# Patient Record
Sex: Female | Born: 1990 | Race: Black or African American | Hispanic: No | Marital: Single | Smoking: Never smoker
Health system: Southern US, Community
[De-identification: ages and names within clinical notes are randomized; demographics above are authoritative.]

## PROBLEM LIST (undated history)

## (undated) ENCOUNTER — Inpatient Hospital Stay: Payer: Self-pay

## (undated) DIAGNOSIS — D649 Anemia, unspecified: Secondary | ICD-10-CM

## (undated) DIAGNOSIS — R112 Nausea with vomiting, unspecified: Secondary | ICD-10-CM

## (undated) DIAGNOSIS — Z9889 Other specified postprocedural states: Secondary | ICD-10-CM

---
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
## 2012-06-04 ENCOUNTER — Emergency Department: Payer: Self-pay | Admitting: Emergency Medicine

## 2012-06-04 LAB — COMPREHENSIVE METABOLIC PANEL
Anion Gap: 7 (ref 7–16)
BUN: 7 mg/dL (ref 7–18)
Bilirubin,Total: 1.3 mg/dL — ABNORMAL HIGH (ref 0.2–1.0)
Chloride: 104 mmol/L (ref 98–107)
Co2: 26 mmol/L (ref 21–32)
Creatinine: 0.63 mg/dL (ref 0.60–1.30)
EGFR (African American): >60
EGFR (Non-African Amer.): >60
SGOT(AST): 39 U/L — ABNORMAL HIGH (ref 15–37)
SGPT (ALT): 61 U/L (ref 12–78)
Total Protein: 7.6 g/dL (ref 6.4–8.2)

## 2012-06-04 LAB — LIPASE, BLOOD: Lipase: 51 U/L — ABNORMAL LOW (ref 73–393)

## 2012-06-04 LAB — URINALYSIS, COMPLETE
Blood: NEGATIVE
Nitrite: NEGATIVE
Ph: 8 (ref 4.5–8.0)
Protein: NEGATIVE
Squamous Epithelial: 8
WBC UR: 5 /HPF (ref 0–5)

## 2012-06-04 LAB — CBC
HCT: 35.9 % (ref 35.0–47.0)
HGB: 12.2 g/dL (ref 12.0–16.0)
MCH: 30 pg (ref 26.0–34.0)
MCV: 88 fL (ref 80–100)
RBC: 4.07 10*6/uL (ref 3.80–5.20)
WBC: 6.3 10*3/uL (ref 3.6–11.0)

## 2012-06-04 LAB — GC/CHLAMYDIA PROBE AMP

## 2012-09-13 ENCOUNTER — Encounter: Payer: Self-pay | Admitting: Maternal and Fetal Medicine

## 2012-10-11 ENCOUNTER — Encounter: Payer: Self-pay | Admitting: Maternal & Fetal Medicine

## 2012-11-22 ENCOUNTER — Encounter: Payer: Self-pay | Admitting: Maternal and Fetal Medicine

## 2012-11-24 ENCOUNTER — Observation Stay: Payer: Self-pay | Admitting: Obstetrics and Gynecology

## 2012-11-24 LAB — COMPREHENSIVE METABOLIC PANEL
Alkaline Phosphatase: 104 U/L (ref 50–136)
Anion Gap: 9 (ref 7–16)
BUN: 10 mg/dL (ref 7–18)
Bilirubin,Total: 0.7 mg/dL (ref 0.2–1.0)
Co2: 22 mmol/L (ref 21–32)
Creatinine: 0.73 mg/dL (ref 0.60–1.30)
EGFR (African American): >60
Osmolality: 274 (ref 275–301)
Potassium: 3.7 mmol/L (ref 3.5–5.1)
SGOT(AST): 23 U/L (ref 15–37)
SGPT (ALT): 15 U/L (ref 12–78)
Sodium: 137 mmol/L (ref 136–145)
Total Protein: 7 g/dL (ref 6.4–8.2)

## 2012-11-24 LAB — URINALYSIS, COMPLETE
Bacteria: NONE SEEN
Bilirubin,UR: NEGATIVE
Glucose,UR: NEGATIVE mg/dL (ref 0–75)
Nitrite: NEGATIVE
Protein: 100
RBC,UR: 2 /HPF (ref 0–5)
Squamous Epithelial: 15
WBC UR: 4 /HPF (ref 0–5)

## 2012-11-25 LAB — CBC WITH DIFFERENTIAL/PLATELET
Basophil #: 0 10*3/uL (ref 0.0–0.1)
Basophil %: 0.2 %
Eosinophil %: 0.2 %
HCT: 27.6 % — ABNORMAL LOW (ref 35.0–47.0)
HGB: 9.1 g/dL — ABNORMAL LOW (ref 12.0–16.0)
Lymphocyte #: 0.8 10*3/uL — ABNORMAL LOW (ref 1.0–3.6)
Lymphocyte %: 5.7 %
MCH: 28.5 pg (ref 26.0–34.0)
MCHC: 32.9 g/dL (ref 32.0–36.0)
MCV: 87 fL (ref 80–100)
Monocyte #: 1.3 x10 3/mm — ABNORMAL HIGH (ref 0.2–0.9)
Monocyte %: 9.1 %
Neutrophil %: 84.8 %
Platelet: 265 10*3/uL (ref 150–440)
RBC: 3.19 10*6/uL — ABNORMAL LOW (ref 3.80–5.20)
RDW: 14.2 % (ref 11.5–14.5)
WBC: 14.2 10*3/uL — ABNORMAL HIGH (ref 3.6–11.0)

## 2012-11-26 LAB — COMPREHENSIVE METABOLIC PANEL
Albumin: 2.1 g/dL — ABNORMAL LOW (ref 3.4–5.0)
Anion Gap: 6 — ABNORMAL LOW (ref 7–16)
BUN: 2 mg/dL — ABNORMAL LOW (ref 7–18)
Chloride: 108 mmol/L — ABNORMAL HIGH (ref 98–107)
Co2: 25 mmol/L (ref 21–32)
Creatinine: 0.62 mg/dL (ref 0.60–1.30)
EGFR (African American): >60
EGFR (Non-African Amer.): >60
Glucose: 94 mg/dL (ref 65–99)
Osmolality: 273 (ref 275–301)
Potassium: 3.2 mmol/L — ABNORMAL LOW (ref 3.5–5.1)
SGPT (ALT): 12 U/L (ref 12–78)
Sodium: 139 mmol/L (ref 136–145)
Total Protein: 5.4 g/dL — ABNORMAL LOW (ref 6.4–8.2)

## 2012-12-23 ENCOUNTER — Encounter: Payer: Self-pay | Admitting: Obstetrics & Gynecology

## 2013-01-03 ENCOUNTER — Encounter: Payer: Self-pay | Admitting: Maternal & Fetal Medicine

## 2013-01-10 ENCOUNTER — Encounter: Payer: Self-pay | Admitting: Obstetrics and Gynecology

## 2013-01-13 ENCOUNTER — Encounter: Payer: Self-pay | Admitting: Obstetrics and Gynecology

## 2013-01-13 ENCOUNTER — Inpatient Hospital Stay: Payer: Self-pay | Admitting: Obstetrics and Gynecology

## 2013-01-13 LAB — CBC WITH DIFFERENTIAL/PLATELET
Basophil #: 0.1 10*3/uL (ref 0.0–0.1)
Basophil %: 0.4 %
Eosinophil #: 0.1 10*3/uL (ref 0.0–0.7)
Eosinophil %: 0.8 %
HCT: 34.1 % — AB (ref 35.0–47.0)
HGB: 11 g/dL — AB (ref 12.0–16.0)
LYMPHS ABS: 2.6 10*3/uL (ref 1.0–3.6)
Lymphocyte %: 20.8 %
MCH: 27.1 pg (ref 26.0–34.0)
MCHC: 32.3 g/dL (ref 32.0–36.0)
MCV: 84 fL (ref 80–100)
MONOS PCT: 9.7 %
Monocyte #: 1.2 x10 3/mm — ABNORMAL HIGH (ref 0.2–0.9)
NEUTROS PCT: 68.3 %
Neutrophil #: 8.4 10*3/uL — ABNORMAL HIGH (ref 1.4–6.5)
PLATELETS: 341 10*3/uL (ref 150–440)
RBC: 4.06 10*6/uL (ref 3.80–5.20)
RDW: 14.9 % — ABNORMAL HIGH (ref 11.5–14.5)
WBC: 12.3 10*3/uL — ABNORMAL HIGH (ref 3.6–11.0)

## 2013-01-14 LAB — GC/CHLAMYDIA PROBE AMP

## 2013-01-15 LAB — HEMATOCRIT: HCT: 27.9 % — AB (ref 35.0–47.0)

## 2013-01-20 LAB — PATHOLOGY REPORT

## 2013-05-13 ENCOUNTER — Emergency Department: Payer: Self-pay | Admitting: Emergency Medicine

## 2013-11-25 LAB — OB RESULTS CONSOLE RPR: RPR: NONREACTIVE

## 2014-01-06 NOTE — L&D Delivery Note (Signed)
Delivery Note At  a viable and healthy female was delivered via  (PresentationROA;  ).  APGAR: , ; weight  .   Placenta status: delivered without notable placental lake, .  Cord loose nuchal x 1 with infant delivered through it:  with the following complications: none.  Cord pH: NA  Anesthesia: none  Episiotomy: none  Lacerations:  none Suture Repair: none Est. Blood Loss (mL):    Mom to postpartum.  Baby to Couplet care / Skin to Skin.  Melody Brush ForkBurr, CNM 06/08/2014, 1:36 AM

## 2014-01-20 LAB — OB RESULTS CONSOLE HEPATITIS B SURFACE ANTIGEN: Hepatitis B Surface Ag: NEGATIVE

## 2014-01-20 LAB — OB RESULTS CONSOLE HGB/HCT, BLOOD
HEMATOCRIT: 36 %
Hemoglobin: 12.2 g/dL

## 2014-01-20 LAB — OB RESULTS CONSOLE VARICELLA ZOSTER ANTIBODY, IGG: Varicella: IMMUNE

## 2014-01-20 LAB — OB RESULTS CONSOLE RPR: RPR: NONREACTIVE

## 2014-01-20 LAB — OB RESULTS CONSOLE HIV ANTIBODY (ROUTINE TESTING): HIV: NONREACTIVE

## 2014-01-20 LAB — OB RESULTS CONSOLE ANTIBODY SCREEN: Antibody Screen: NEGATIVE

## 2014-01-20 LAB — OB RESULTS CONSOLE GC/CHLAMYDIA
Chlamydia: NEGATIVE
GC PROBE AMP, GENITAL: NEGATIVE

## 2014-01-20 LAB — OB RESULTS CONSOLE ABO/RH: RH Type: POSITIVE

## 2014-01-20 LAB — OB RESULTS CONSOLE RUBELLA ANTIBODY, IGM: Rubella: IMMUNE

## 2014-01-20 LAB — OB RESULTS CONSOLE PLATELET COUNT: PLATELETS: 333 10*3/uL

## 2014-04-12 ENCOUNTER — Observation Stay: Admit: 2014-04-12 | Disposition: A | Discharge status: Home / Self Care | Payer: Self-pay

## 2014-04-12 LAB — URINALYSIS, COMPLETE
BACTERIA: NONE SEEN
BLOOD: NEGATIVE
Bilirubin,UR: NEGATIVE
Glucose,UR: NEGATIVE mg/dL (ref 0–75)
Ketone: NEGATIVE
NITRITE: NEGATIVE
PH: 6 (ref 4.5–8.0)
PROTEIN: NEGATIVE
RBC,UR: 1 /HPF (ref 0–5)
SPECIFIC GRAVITY: 1.015 (ref 1.003–1.030)
WBC UR: 9 /HPF (ref 0–5)

## 2014-04-29 NOTE — Consult Note (Signed)
Referral Information:  Reason for Referral NST only for placental abnormality of uncertain etiology and EFW 10th% (has follow up US for growth on January 8)   Allergies:   Penicillin: Hives  Impression/Recommendations:  Impression 24yo G2P1 at 1483w3d here for NST only (has AFI/growth ultrasound on Thursday already scheduled) due to concerns for placental abnormality and EFW at the 10th percentile. NST: FHR baseline 140's moderate variability 15x15 accelerations; no decelerations No significant uterine activity Reactive NST.   Recommendations Follow up on Thursday for scheduled ultrasound.   Electronic Signatures: Kirby FunkEllestad, Lakin Romer (MD)  (Signed 05-Jan-15 12:46)  Authored: Referral, Allergies, Impression   Last Updated: 05-Jan-15 12:46 by Kirby FunkEllestad, Homar Weinkauf (MD)

## 2014-04-29 NOTE — Op Note (Signed)
PATIENT NAME:  Jaclyn Austin, Jaclyn Austin MR#:  [id number] DATE OF BIRTH:  [birth year]  DATE OF PROCEDURE:  01/14/2013  PREOPERATIVE DIAGNOSES:  1.  A 38-week gestation with intrauterine growth restriction. 2.  Fetal intolerance to induction.  POSTOPERATIVE DIAGNOSES:  1.  A 38-week gestation with intrauterine growth restriction. 2.  Fetal intolerance to induction. 3.  Primary low transverse cesarean section.   SURGERY: Primary low transverse cesarean.   SURGEON: Ricky L. Logan BoresEvans, MD   ASSISTANT: Lawson FiscalLori, scrub nurse.  ANESTHESIA: She was given a spinal by Dr. Henrene HawkingKephart.   FINDINGS: Grossly normal abdomen. Grossly normal, vigorous, small female infant. Placenta was small with some calcifications, sent for pathology. Grossly normal uterus, tubes, and ovaries.   ESTIMATED BLOOD LOSS: 500.   COMPLICATIONS: None.   DRAINS: Foley. On-Q pain pump was placed.   ANTIBIOTICS: Ancef 2 grams.   PROCEDURE IN DETAIL: The patient was consented last evening for induction of labor at the request of [facility] due to intrauterine growth restriction, 5th percentile, and known placental abnormalities by ultrasound. Cervidil was placed last evening. Infant began experiencing intermittent late decelerations with otherwise good baseline variability, prompting removal of Cervidil at approximately 3:00 a.m. There were still spontaneous late-appearing decelerations throughout the morning, and I had a conversation with the patient around 6:00 a.m. that we should consider cesarean section. They wanted to proceed ahead and the plan was if we got a couple of hours without decels, we would resume with Pitocin. Continued to have occasional sporadic late-appearing decelerations but otherwise good variability and at approximately 12:00, the patient stated she desired to proceed with cesarean section. Consent was signed.   The patient was taken to the operating room and placed in a sitting position. Spinal was placed. She was then  placed in the supine position. Foley catheter was placed. Abdomen was prepped and draped in the usual sterile fashion. After assuring adequate anesthesia, a #10 blade was used to create Pfannenstiel incision, and primary low transverse cesarean section was carried out in the usual fashion. Running interlocking chromic on the uterus. The pelvis was irrigated. The rectus was hemostatic. Fascia was closed left to midline, and then On-Q pump catheters were placed x 2. Remainder of fascia was closed with Vicryl. A single stitch of 0 chromic was used to close a lax-appearing area in the midline fascial closure. Subcutaneous was made hemostatic with cautery. The skin was closed with surgical clips.   On-Q catheters were bolused, secured, and labeled in the usual fashion. The patient tolerated the procedure well. All instrument, needle, and sponge counts were correct. Anticipate a routine postoperative course.    ____________________________ Reatha Harpsicky L. Logan BoresEvans, MD rle:jcm D: 01/14/2013 14:15:16 ET T: 01/14/2013 15:36:09 ET JOB#: [id number]  cc: Ricky L. Logan BoresEvans, MD, <Dictator> Augustina MoodICK L Kenyan Karnes MD ELECTRONICALLY SIGNED 01/17/2013 11:31

## 2014-05-10 LAB — OB RESULTS CONSOLE GBS: GBS: NEGATIVE

## 2014-05-10 LAB — OB RESULTS CONSOLE GC/CHLAMYDIA
CHLAMYDIA, DNA PROBE: NEGATIVE
Gonorrhea: NEGATIVE

## 2014-05-16 NOTE — H&P (Signed)
L&D Evaluation:  History:  HPI 24 y/o BF G2P1 EDC 01/28/13 30weeks   Presents with nausea/vomiting, no PO "all day"   Patient's Medical History No Chronic Illness  preeclampsia with 1st preg; del at 37 weeks   Patient's Surgical History none   Medications Pre Natal Vitamins   Allergies NKDA, PCN, hives   Social History none   ROS:  ROS All systems were reviewed.  HEENT, CNS, GI, GU, Respiratory, CV, Renal and Musculoskeletal systems were found to be normal., except N/V.  Denies diarrhea/constipation   Exam:  Vital Signs stable   Urine Protein not completed   General no apparent distress   Abdomen gravid, non-tender   Estimated Fetal Weight Average for gestational age   Back no CVAT   Mebranes Intact   FHT normal rate with no decels   Ucx iritabilty   Impression:  Impression N/V with dehydration   Plan:  Comments IVF Anti-emetics Met-C, Ua   Electronic Signatures: Edison Nasuti (MD)  (Signed [phone] 21:42)  Authored: L&D Evaluation   Last Updated: 19-Nov-14 21:42 by Edison Nasuti (MD)

## 2014-05-16 NOTE — H&P (Signed)
L&D Evaluation:  History:  HPI 24 y/o P1 Followed at [facility] with [facility]; found to be in 5% yesterday EDC 01/28/13   Presents with iugr   Patient's Medical History No Chronic Illness   Allergies PCN, Vanc   Social History none   Family History Non-Contributory   ROS:  ROS All systems were reviewed.  HEENT, CNS, GI, GU, Respiratory, CV, Renal and Musculoskeletal systems were found to be normal.   Exam:  Vital Signs stable   General no apparent distress   Mental Status clear   Abdomen gravid, non-tender   Estimated Fetal Weight Small for gestational age   Pelvic 1/20/hi   Mebranes Intact   FHT good variability; several late decels thru night with good recovery; Cervidil removed aroiund 3 am.  Terb given 1/2 hr ago   FHT Description Late decelerations   Ucx rare   Impression:  Impression IUGR for induction; intolerant of cervidil; remote from delivery   Plan:  Comments I spoke with pt and FOB Reviewed fetal tracing and intolerance of contractions, leading to removal of cervidil and now teb. Reviewed remoteness from delivery and likelihood that infant will not tolerate labor Baby is OK now; if we continue with induction, may lead to fetal stress and need to delivery urgently/emergently. After discussion amongst the two of them, they have decided to press forward with induction. I don't think I can place foley due to posterior/long cervix. Will begin terb later this am and keep rate low; hopeful to place foley transcervically when feasible   Electronic Signatures: Margaretha GlassingEvans, Ricky L (MD)  (Signed 09-Jan-15 06:51)  Authored: L&D Evaluation   Last Updated: 09-Jan-15 06:51 by Margaretha GlassingEvans, Ricky L (MD)

## 2014-05-28 ENCOUNTER — Observation Stay
Admission: EM | Admit: 2014-05-28 | Discharge: 2014-05-28 | Disposition: A | Discharge status: Home / Self Care | Payer: Medicaid Other | Attending: Obstetrics and Gynecology | Admitting: Obstetrics and Gynecology

## 2014-05-28 DIAGNOSIS — O368190 Decreased fetal movements, unspecified trimester, not applicable or unspecified: Secondary | ICD-10-CM | POA: Diagnosis not present

## 2014-05-28 HISTORY — DX: Nausea with vomiting, unspecified: Z98.890

## 2014-05-28 HISTORY — DX: Nausea with vomiting, unspecified: R11.2

## 2014-05-28 HISTORY — DX: Anemia, unspecified: D64.9

## 2014-06-07 ENCOUNTER — Inpatient Hospital Stay
Admission: EM | Admit: 2014-06-07 | Discharge: 2014-06-10 | DRG: 775 | Disposition: A | Discharge status: Home / Self Care | Payer: Medicaid Other | Attending: Obstetrics and Gynecology | Admitting: Obstetrics and Gynecology

## 2014-06-07 DIAGNOSIS — Z809 Family history of malignant neoplasm, unspecified: Secondary | ICD-10-CM

## 2014-06-07 DIAGNOSIS — Z3A39 39 weeks gestation of pregnancy: Secondary | ICD-10-CM | POA: Diagnosis present

## 2014-06-07 DIAGNOSIS — O3421 Maternal care for scar from previous cesarean delivery: Principal | ICD-10-CM | POA: Diagnosis present

## 2014-06-08 ENCOUNTER — Encounter: Payer: Self-pay | Admitting: Obstetrics and Gynecology

## 2014-06-08 DIAGNOSIS — O3421 Maternal care for scar from previous cesarean delivery: Secondary | ICD-10-CM | POA: Diagnosis present

## 2014-06-08 DIAGNOSIS — Z3483 Encounter for supervision of other normal pregnancy, third trimester: Secondary | ICD-10-CM | POA: Diagnosis not present

## 2014-06-08 DIAGNOSIS — Z3A39 39 weeks gestation of pregnancy: Secondary | ICD-10-CM | POA: Diagnosis present

## 2014-06-08 DIAGNOSIS — Z809 Family history of malignant neoplasm, unspecified: Secondary | ICD-10-CM | POA: Diagnosis not present

## 2014-06-08 LAB — CBC
HEMATOCRIT: 34.9 % — AB (ref 35.0–47.0)
Hemoglobin: 11.5 g/dL — ABNORMAL LOW (ref 12.0–16.0)
MCH: 28.3 pg (ref 26.0–34.0)
MCHC: 33 g/dL (ref 32.0–36.0)
MCV: 85.9 fL (ref 80.0–100.0)
PLATELETS: 309 10*3/uL (ref 150–440)
RBC: 4.07 MIL/uL (ref 3.80–5.20)
RDW: 14.9 % — ABNORMAL HIGH (ref 11.5–14.5)
WBC: 18.3 10*3/uL — AB (ref 3.6–11.0)

## 2014-06-08 MED ORDER — WITCH HAZEL-GLYCERIN EX PADS: 1.0000 "application " | MEDICATED_PAD | CUTANEOUS | Status: DC | PRN

## 2014-06-08 MED ORDER — OXYTOCIN BOLUS FROM INFUSION: 500.0000 mL | INTRAVENOUS | Status: DC

## 2014-06-08 MED ORDER — DIPHENHYDRAMINE HCL 25 MG PO CAPS: 25.0000 mg | ORAL_CAPSULE | Freq: Four times a day (QID) | ORAL | Status: DC | PRN

## 2014-06-08 MED ORDER — OXYCODONE-ACETAMINOPHEN 5-325 MG PO TABS
1.0000 | ORAL_TABLET | ORAL | Status: DC | PRN
  Administered 2014-06-08: 1 via ORAL

## 2014-06-08 MED ORDER — IBUPROFEN 600 MG PO TABS
600.0000 mg | ORAL_TABLET | Freq: Four times a day (QID) | ORAL | Status: DC
  Administered 2014-06-08: 600 mg via ORAL

## 2014-06-08 MED ORDER — CITRIC ACID-SODIUM CITRATE 334-500 MG/5ML PO SOLN: 30.0000 mL | ORAL | Status: DC | PRN

## 2014-06-08 MED ORDER — LANOLIN HYDROUS EX OINT: TOPICAL_OINTMENT | CUTANEOUS | Status: DC | PRN

## 2014-06-08 MED ORDER — SENNOSIDES-DOCUSATE SODIUM 8.6-50 MG PO TABS
2.0000 | ORAL_TABLET | ORAL | Status: DC
  Administered 2014-06-08 – 2014-06-10 (×2): 2 via ORAL
  Filled 2014-06-08 (×2): qty 2

## 2014-06-08 MED ORDER — BENZOCAINE-MENTHOL 20-0.5 % EX AERO: 1.0000 "application " | INHALATION_SPRAY | CUTANEOUS | Status: DC | PRN

## 2014-06-08 MED ORDER — ONDANSETRON HCL 4 MG/2ML IJ SOLN: 4.0000 mg | Freq: Four times a day (QID) | INTRAMUSCULAR | Status: DC | PRN

## 2014-06-08 MED ORDER — PRENATAL MULTIVITAMIN CH: 1.0000 | ORAL_TABLET | Freq: Every day | ORAL | Status: DC

## 2014-06-08 MED ORDER — IBUPROFEN 600 MG PO TABS
600.0000 mg | ORAL_TABLET | Freq: Four times a day (QID) | ORAL | Status: DC
  Administered 2014-06-08 – 2014-06-10 (×8): 600 mg via ORAL
  Filled 2014-06-08 (×8): qty 1

## 2014-06-08 MED ORDER — LACTATED RINGERS IV SOLN: INTRAVENOUS | Status: DC

## 2014-06-08 MED ORDER — LACTATED RINGERS IV SOLN: 500.0000 mL | INTRAVENOUS | Status: DC | PRN

## 2014-06-08 MED ORDER — OXYTOCIN 40 UNITS IN LACTATED RINGERS INFUSION - SIMPLE MED: 62.5000 mL/h | INTRAVENOUS | Status: DC | PRN

## 2014-06-08 MED ORDER — ONDANSETRON HCL 4 MG/2ML IJ SOLN: 4.0000 mg | INTRAMUSCULAR | Status: DC | PRN

## 2014-06-08 MED ORDER — DIBUCAINE 1 % RE OINT: 1.0000 "application " | TOPICAL_OINTMENT | RECTAL | Status: DC | PRN

## 2014-06-08 MED ORDER — BUTORPHANOL TARTRATE 1 MG/ML IJ SOLN
INTRAMUSCULAR | Status: AC
  Filled 2014-06-08: qty 1

## 2014-06-08 MED ORDER — HYDROCODONE-ACETAMINOPHEN 5-325 MG PO TABS
ORAL_TABLET | ORAL | Status: AC
  Administered 2014-06-08: 1 via ORAL
  Filled 2014-06-08: qty 1

## 2014-06-08 MED ORDER — SENNOSIDES-DOCUSATE SODIUM 8.6-50 MG PO TABS: 2.0000 | ORAL_TABLET | ORAL | Status: DC

## 2014-06-08 MED ORDER — PRENATAL MULTIVITAMIN CH
1.0000 | ORAL_TABLET | Freq: Every day | ORAL | Status: DC
  Administered 2014-06-08 – 2014-06-10 (×3): 1 via ORAL
  Filled 2014-06-08 (×2): qty 1

## 2014-06-08 MED ORDER — SIMETHICONE 80 MG PO CHEW: 80.0000 mg | CHEWABLE_TABLET | ORAL | Status: DC | PRN

## 2014-06-08 MED ORDER — OXYTOCIN 40 UNITS IN LACTATED RINGERS INFUSION - SIMPLE MED: 62.5000 mL/h | INTRAVENOUS | Status: DC

## 2014-06-08 MED ORDER — ACETAMINOPHEN 325 MG PO TABS: 650.0000 mg | ORAL_TABLET | ORAL | Status: DC | PRN

## 2014-06-08 MED ORDER — BUTORPHANOL TARTRATE 1 MG/ML IJ SOLN: 2.0000 mg | INTRAMUSCULAR | Status: DC | PRN

## 2014-06-08 MED ORDER — OXYTOCIN 10 UNIT/ML IJ SOLN
10.0000 [IU] | Freq: Once | INTRAMUSCULAR | Status: AC
  Administered 2014-06-08: 10 [IU] via INTRAMUSCULAR

## 2014-06-08 MED ORDER — ACETAMINOPHEN 325 MG PO TABS
650.0000 mg | ORAL_TABLET | ORAL | Status: DC | PRN
  Administered 2014-06-08 – 2014-06-09 (×3): 650 mg via ORAL
  Filled 2014-06-08 (×3): qty 2

## 2014-06-08 MED ORDER — OXYCODONE-ACETAMINOPHEN 5-325 MG PO TABS
2.0000 | ORAL_TABLET | ORAL | Status: DC | PRN
  Administered 2014-06-08: 2 via ORAL
  Filled 2014-06-08: qty 2

## 2014-06-08 MED ORDER — IBUPROFEN 600 MG PO TABS
ORAL_TABLET | ORAL | Status: AC
  Administered 2014-06-08: 600 mg via ORAL
  Filled 2014-06-08: qty 1

## 2014-06-08 MED ORDER — OXYCODONE-ACETAMINOPHEN 5-325 MG PO TABS
1.0000 | ORAL_TABLET | ORAL | Status: DC | PRN
  Administered 2014-06-08 (×3): 1 via ORAL
  Filled 2014-06-08 (×2): qty 1

## 2014-06-08 MED ORDER — OXYCODONE-ACETAMINOPHEN 5-325 MG PO TABS
2.0000 | ORAL_TABLET | ORAL | Status: DC | PRN
  Administered 2014-06-08: 2 via ORAL
  Filled 2014-06-08 (×2): qty 2

## 2014-06-08 MED ORDER — LIDOCAINE HCL (PF) 1 % IJ SOLN
30.0000 mL | INTRAMUSCULAR | Status: DC | PRN
  Filled 2014-06-08: qty 30

## 2014-06-08 NOTE — H&P (Signed)
Obstetric History and Physical  Jaclyn PettiesBobbie Montalvo is a 24 y.o. [id number] with IUP at 6866w5d presenting with active labor . Patient states she has been having  regular, every 5 minutes contractions, none vaginal bleeding, ruptured, clear fluid membranes, with active fetal movement.    [facility] Course [facility]  Pregnancy complications or risks:previous vaginal birth followed by c/s for fetal distress' placental lake w/o bleeding  Prenatal labs and studies: ABO, Rh: O/Positive/-- (01/15 0000) Antibody: Negative (01/15 0000) Rubella: Immune (01/15 0000) RPR: Nonreactive (01/15 0000)  HBsAg: Negative (01/15 0000)  HIV: Non-reactive (01/15 0000)  AVW:UJWJXBJYGBS:Negative (05/04 0000) 1 hr Glucola  NA Genetic screening too late entry to care Anatomy US normal  Past Medical History  Diagnosis Date  . PONV (postoperative nausea and vomiting)   . Anemia     Past Surgical History  Procedure Laterality Date  . Cesarean section      OB History  Gravida Para Term Preterm AB SAB TAB Ectopic Multiple Living  4 2 2  1 1    2     # Outcome Date GA Lbr Len/2nd Weight Sex Delivery Anes PTL Lv  4 Current           3 Term 01/14/13 7165w0d  2.722 kg (6 lb) F CS-LTranv   Y  2 SAB 05/22/11          1 Term 10/02/08 8165w0d  2.807 kg (6 lb 3 oz) F Vag-Spont   Y      History   Social History  . Marital Status: Single    Spouse Name: N/A  . Number of Children: N/A  . Years of Education: N/A   Social History Main Topics  . Smoking status: Never Smoker   . Smokeless tobacco: Never Used  . Alcohol Use: No  . Drug Use: No  . Sexual Activity: Yes   Other Topics Concern  . Not on file   Social History Narrative  . No narrative on file    Family History  Problem Relation Age of Onset  . Cancer Sister   . Cancer Maternal Grandmother     Prescriptions prior to admission  Medication Sig Dispense Refill Last Dose  . Prenatal Vit-Fe Fumarate-FA (PRENATAL MULTIVITAMIN) TABS tablet Take 1 tablet by  mouth daily at 12 noon.       No Known Allergies  Review of Systems: Negative except for what is mentioned in HPI.  Physical Exam: There were no vitals taken for this visit. GENERAL: Well-developed, well-nourished female in no acute distress.  LUNGS: Clear to auscultation bilaterally.  HEART: Regular rate and rhythm. ABDOMEN: Soft, nontender, nondistended, gravid. EXTREMITIES: Nontender, no edema, 2+ distal pulses. Cervical Exam: Dilation: 5 Effacement (%): 80 Cervical Position: Middle Station: -2 Presentation: Vertex Exam by:: HFC FHT:  Baseline rate 125 bpm   Variability moderate  Accelerations present   Decelerations early Contractions: Every 2 mins   Pertinent Labs/Studies:   No results found for this or any previous visit (from the past 24 hour(s)).  Assessment : Jaclyn Austin is a 24 y.o. [id number] at 766w5d being admitted for labor.  Plan: Labor: Expectant management.  Induction/Augmentation as needed, per protocol FWB: Reassuring fetal heart tracing.  GBS negative Delivery plan: Hopeful for vaginal delivery  Jasmeet Gehl Ines BloomerBurr, CNM [facility], [facility]

## 2014-06-08 NOTE — Progress Notes (Signed)
Jaclyn PettiesBobbie Seedorf is a 24 y.o. [id number] at 6949w5d by ultrasound admitted for active labor  Subjective: Reports small gush of clear fluid on admission with regular painful contractions  Objective: Painful strong contractions, NTZ +;        FHT:  FHR: 125 bpm, variability: minimal ,  accelerations:  Present,  decelerations:  Present early UC:   regular, every 3 minutes SVE:   Dilation: Lip/rim Effacement (%): 90 Station: 0 Exam by:: HFC  Labs: Lab Results  Component Value Date   WBC 12.3* 01/13/2013   HGB 12.2 01/20/2014   HCT 36 01/20/2014   MCV 84 01/13/2013   PLT 333 01/20/2014    Assessment / Plan: Spontaneous labor, progressing normally, attempted VBAC- Anesthesia and Dr Valentino Saxonherry aware and in route  Labor: Progressing normally Preeclampsia:  labs stable and NA Fetal Wellbeing:  Category II Pain Control:  Labor support without medications I/D:  n/a Anticipated MOD:  NSVD  Leidy Massar Burr, CNM 06/08/2014, 1:20 AM

## 2014-06-09 ENCOUNTER — Encounter: Payer: Medicaid Other | Admitting: Obstetrics and Gynecology

## 2014-06-09 ENCOUNTER — Encounter: Payer: Self-pay | Admitting: Obstetrics and Gynecology

## 2014-06-09 LAB — CBC
HCT: 35.3 % (ref 35.0–47.0)
HEMOGLOBIN: 11.4 g/dL — AB (ref 12.0–16.0)
MCH: 27.7 pg (ref 26.0–34.0)
MCHC: 32.1 g/dL (ref 32.0–36.0)
MCV: 86.1 fL (ref 80.0–100.0)
PLATELETS: 297 10*3/uL (ref 150–440)
RBC: 4.1 MIL/uL (ref 3.80–5.20)
RDW: 15.1 % — AB (ref 11.5–14.5)
WBC: 13.5 10*3/uL — AB (ref 3.6–11.0)

## 2014-06-09 LAB — RPR: RPR Ser Ql: NONREACTIVE

## 2014-06-09 MED ORDER — ONDANSETRON 4 MG PO TBDP
4.0000 mg | ORAL_TABLET | Freq: Four times a day (QID) | ORAL | Status: DC
  Administered 2014-06-09 – 2014-06-10 (×3): 4 mg via ORAL
  Filled 2014-06-09 (×8): qty 1

## 2014-06-09 MED ORDER — IBUPROFEN 600 MG PO TABS: 600.0000 mg | ORAL_TABLET | Freq: Four times a day (QID) | ORAL | Status: DC

## 2014-06-09 NOTE — Discharge Instructions (Signed)
Vaginal Delivery, Care After °Refer to this sheet in the next few weeks. These discharge instructions provide you with information on caring for yourself after delivery. Your caregiver may also give you specific instructions. Your treatment has been planned according to the most current medical practices available, but problems sometimes occur. Call your caregiver if you have any problems or questions after you go home. °HOME CARE INSTRUCTIONS °· Take over-the-counter or prescription medicines only as directed by your caregiver or pharmacist. °· Do not drink alcohol, especially if you are breastfeeding or taking medicine to relieve pain. °· Do not chew or smoke tobacco. °· Do not use illegal drugs. °· Continue to use good perineal care. Good perineal care includes: °¨ Wiping your perineum from front to back. °¨ Keeping your perineum clean. °· Do not use tampons or douche until your caregiver says it is okay. °· Shower, wash your hair, and take tub baths as directed by your caregiver. °· Wear a well-fitting bra that provides breast support. °· Eat healthy foods. °· Drink enough fluids to keep your urine clear or pale yellow. °· Eat high-fiber foods such as whole grain cereals and breads, brown rice, beans, and fresh fruits and vegetables every day. These foods may help prevent or relieve constipation. °· Follow your caregiver's recommendations regarding resumption of activities such as climbing stairs, driving, lifting, exercising, or traveling. °· Talk to your caregiver about resuming sexual activities. Resumption of sexual activities is dependent upon your risk of infection, your rate of healing, and your comfort and desire to resume sexual activity. °· Try to have someone help you with your household activities and your newborn for at least a few days after you leave the hospital. °· Rest as much as possible. Try to rest or take a nap when your newborn is sleeping. °· Increase your activities gradually. °· Keep  all of your scheduled postpartum appointments. It is very important to keep your scheduled follow-up appointments. At these appointments, your caregiver will be checking to make sure that you are healing physically and emotionally. °SEEK MEDICAL CARE IF:  °· You are passing large clots from your vagina. Save any clots to show your caregiver. °· You have a foul smelling discharge from your vagina. °· You have trouble urinating. °· You are urinating frequently. °· You have pain when you urinate. °· You have a change in your bowel movements. °· You have increasing redness, pain, or swelling near your vaginal incision (episiotomy) or vaginal tear. °· You have pus draining from your episiotomy or vaginal tear. °· Your episiotomy or vaginal tear is separating. °· You have painful, hard, or reddened breasts. °· You have a severe headache. °· You have blurred vision or see spots. °· You feel sad or depressed. °· You have thoughts of hurting yourself or your newborn. °· You have questions about your care, the care of your newborn, or medicines. °· You are dizzy or light-headed. °· You have a rash. °· You have nausea or vomiting. °· You were breastfeeding and have not had a menstrual period within 12 weeks after you stopped breastfeeding. °· You are not breastfeeding and have not had a menstrual period by the 12th week after delivery. °· You have a fever. °SEEK IMMEDIATE MEDICAL CARE IF:  °· You have persistent pain. °· You have chest pain. °· You have shortness of breath. °· You faint. °· You have leg pain. °· You have stomach pain. °· Your vaginal bleeding saturates two or more sanitary pads   in 1 hour. °MAKE SURE YOU:  °· Understand these instructions. °· Will watch your condition. °· Will get help right away if you are not doing well or get worse. °Document Released: 12/21/1999 Document Revised: 05/09/2013 Document Reviewed: 08/20/2011 °ExitCare® Patient Information ©2015 [employer]. This information is not intended to  replace advice given to you by your health care provider. Make sure you discuss any questions you have with your health care provider. ° °Call your doctor for increased pain or vaginal bleeding, temperature above 100.4, depression, or concerns.  No strenuous activity or heavy lifting for 6 weeks.  No intercourse, tampons, douching, or enemas for 6 weeks.  No tub baths-showers only.  No driving for 2 weeks or while taking pain medications.  Continue prenatal vitamin and iron.   ° °

## 2014-06-09 NOTE — Discharge Summary (Signed)
Obstetric Discharge Summary Reason for Admission: onset of labor Prenatal Procedures: NST and ultrasound Intrapartum Procedures: spontaneous vaginal delivery and successful VBAC Postpartum Procedures: none Complications-Operative and Postpartum: none HEMOGLOBIN  Date Value Ref Range Status  06/09/2014 11.4* 12.0 - 16.0 g/dL Final  40/98/119101/15/2016 47.812.2 g/dL Final   HGB  Date Value Ref Range Status  01/13/2013 11.0* 12.0-16.0 g/dL Final   HCT  Date Value Ref Range Status  06/09/2014 35.3 35.0 - 47.0 % Final  01/20/2014 36 % Final  01/15/2013 27.9* 35.0-47.0 % Final    Physical Exam:  General: alert, cooperative and fatigued Lochia: appropriate Uterine Fundus: firm Incision: na DVT Evaluation: No evidence of DVT seen on physical exam. Negative Homan's sign.  Discharge Diagnoses: Term Pregnancy-delivered and Successful VBAC  Discharge Information: Date: 06/09/2014 Activity: pelvic rest Diet: routine and breastfeeding friendly and high in iron Medications: PNV and Ibuprofen Condition: stable Instructions: To schedule appointment in 5 weeks for Nexplanon insertion and 6 weeks for PPV Discharge to: home   Newborn Data: Live born female  Birth Weight: 6 lb 8.2 oz (2955 g) APGAR: ,   Home with mother.  Alishba Naples AccovilleBurr, CNM 06/09/2014, 1:16 PM

## 2014-06-09 NOTE — Plan of Care (Signed)
Problem: Phase II Progression Outcomes Goal: Tolerating diet Outcome: Not Met (add Reason) Patient vomitted 600 ml crackers/brown liquid; patient states she feels better

## 2014-06-09 NOTE — Progress Notes (Signed)
Post Partum Day 1 Subjective: up ad lib and vomiting from percocet and moderate afterbirth pains when breastfeeding  Objective: Blood pressure 107/65, pulse 73, temperature 98.6 F (37 C), temperature source Oral, resp. rate 20, SpO2 99 %, unknown if currently breastfeeding.  Physical Exam:  General: alert, cooperative and fatigued Lochia: appropriate Uterine Fundus: firm Incision: na DVT Evaluation: No evidence of DVT seen on physical exam. Negative Homan's sign.   Recent Labs  06/08/14 0438 06/09/14 0354  HGB 11.5* 11.4*  HCT 34.9* 35.3    Assessment/Plan: Discharge home Infant feeding exclusively Breast;     LOS: 1 day   Melody Burr, CNM 06/09/2014, 1:09 PM

## 2014-06-10 NOTE — Progress Notes (Signed)
Discharge instructions provided.  Pt and sig other verbalize understanding of all instructions and follow-up care.  Pt discharged to home with infant at 1340 on 06/10/14 via wheelchair by RN. Reynold BowenSusan Paisley Quinn Quam, RN 06/10/2014 2:19 PM

## 2014-06-10 NOTE — Progress Notes (Signed)
Prenatal records indicate that pt received TDaP vaccine on 03/15/14. Reynold BowenSusan Paisley Janis Sol, RN 06/10/2014 10:29 AM

## 2014-06-16 ENCOUNTER — Encounter: Payer: Self-pay | Admitting: Obstetrics and Gynecology

## 2014-07-19 ENCOUNTER — Encounter: Payer: Self-pay | Admitting: *Deleted

## 2014-07-20 ENCOUNTER — Ambulatory Visit: Payer: Self-pay | Admitting: Obstetrics and Gynecology

## 2015-03-08 ENCOUNTER — Emergency Department
Admission: EM | Admit: 2015-03-08 | Discharge: 2015-03-08 | Disposition: A | Discharge status: Home / Self Care | Payer: Medicaid Other | Attending: Emergency Medicine | Admitting: Emergency Medicine

## 2015-03-08 ENCOUNTER — Encounter: Payer: Self-pay | Admitting: *Deleted

## 2015-03-08 DIAGNOSIS — N939 Abnormal uterine and vaginal bleeding, unspecified: Secondary | ICD-10-CM | POA: Diagnosis present

## 2015-03-08 DIAGNOSIS — Z79899 Other long term (current) drug therapy: Secondary | ICD-10-CM | POA: Insufficient documentation

## 2015-03-08 DIAGNOSIS — N938 Other specified abnormal uterine and vaginal bleeding: Secondary | ICD-10-CM | POA: Diagnosis not present

## 2015-03-08 DIAGNOSIS — Z3202 Encounter for pregnancy test, result negative: Secondary | ICD-10-CM | POA: Diagnosis not present

## 2015-03-08 DIAGNOSIS — Z880 Allergy status to penicillin: Secondary | ICD-10-CM | POA: Diagnosis not present

## 2015-03-08 LAB — CBC
HCT: 38.5 % (ref 35.0–47.0)
Hemoglobin: 12.6 g/dL (ref 12.0–16.0)
MCH: 28.4 pg (ref 26.0–34.0)
MCHC: 32.8 g/dL (ref 32.0–36.0)
MCV: 86.7 fL (ref 80.0–100.0)
Platelets: 311 10*3/uL (ref 150–440)
RBC: 4.44 MIL/uL (ref 3.80–5.20)
RDW: 13.9 % (ref 11.5–14.5)
WBC: 5 10*3/uL (ref 3.6–11.0)

## 2015-03-08 LAB — WET PREP, GENITAL
CLUE CELLS WET PREP: NONE SEEN
SPERM: NONE SEEN
TRICH WET PREP: NONE SEEN
Yeast Wet Prep HPF POC: NONE SEEN

## 2015-03-08 LAB — CHLAMYDIA/NGC RT PCR (ARMC ONLY)
Chlamydia Tr: NOT DETECTED
N gonorrhoeae: NOT DETECTED

## 2015-03-08 LAB — POCT PREGNANCY, URINE: Preg Test, Ur: NEGATIVE

## 2015-03-08 NOTE — ED Notes (Addendum)
States vaginal bleeding since her surgical abortion on 1/31, states she is passing clots, denies any pain, states she did not go for her follow-up after the abortion

## 2015-03-08 NOTE — ED Notes (Signed)
Patient with no complaints at this time. Respirations even and unlabored. Skin warm/dry. Discharge instructions reviewed with patient at this time. Patient given opportunity to voice concerns/ask questions. Patient discharged at this time and left Emergency Department with steady gait.   

## 2015-03-08 NOTE — Discharge Instructions (Signed)

## 2015-03-08 NOTE — ED Provider Notes (Signed)
[facility] Emergency Department Provider Note  ____________________________________________  Time seen: Approximately 4:20 PM  I have reviewed the triage vital signs and the nursing notes.   HISTORY  Chief Complaint Vaginal Bleeding    HPI Jaclyn Austin is a 25 y.o. female with a history of a "surgical abortion" 1 month ago who is presenting with persistent vaginal bleeding. She says that the bleeding is worse in the morning and she often has clots when she wakes up. She says that the surgery was done in [place] and she never followed up because of persistent bleeding. She denies any abdominal pain. She has recently just moved to the area. Does not report any lightheadedness or dizziness or feeling like she will pass out.   Past Medical History  Diagnosis Date  . PONV (postoperative nausea and vomiting)   . Anemia     Patient Active Problem List   Diagnosis Date Noted  . Labor and delivery, indication for care 06/08/2014  . Labor and delivery complicated by cord around neck without compression 06/08/2014  . Vaginal delivery 06/08/2014  . Decreased fetal movement 05/28/2014    Past Surgical History  Procedure Laterality Date  . Cesarean section      Current Outpatient Rx  Name  Route  Sig  Dispense  Refill  . ibuprofen (ADVIL,MOTRIN) 600 MG tablet   Oral   Take 1 tablet (600 mg total) by mouth every 6 (six) hours.   60 tablet   0   . Prenatal Vit-Fe Fumarate-FA (PRENATAL MULTIVITAMIN) TABS tablet   Oral   Take 1 tablet by mouth daily at 12 noon.           Allergies Penicillins  Family History  Problem Relation Age of Onset  . Cancer Sister   . Cancer Maternal Grandmother     Social History Social History  Substance Use Topics  . Smoking status: Never Smoker   . Smokeless tobacco: Never Used  . Alcohol Use: No    Review of Systems Constitutional: No fever/chills Eyes: No visual changes. ENT: No sore  throat. Cardiovascular: Denies chest pain. Respiratory: Denies shortness of breath. Gastrointestinal: No abdominal pain.  No nausea, no vomiting.  No diarrhea.  No constipation. Genitourinary: Negative for dysuria. Musculoskeletal: Negative for back pain. Skin: Negative for rash. Neurological: Negative for headaches, focal weakness or numbness.  10-point ROS otherwise negative.  ____________________________________________   PHYSICAL EXAM:  VITAL SIGNS: ED Triage Vitals  Enc Vitals Group     BP 03/08/15 1335 138/92 mmHg     Pulse Rate 03/08/15 1335 92     Resp 03/08/15 1335 20     Temp 03/08/15 1335 98.9 F (37.2 C)     Temp Source 03/08/15 1335 Oral     SpO2 03/08/15 1335 100 %     Weight 03/08/15 1335 125 lb (56.7 kg)     Height 03/08/15 1335  (1.626 m)     Head Cir --      Peak Flow --      Pain Score 03/08/15 1348 0     Pain Loc --      Pain Edu? --      Excl. in GC? --     Constitutional: Alert and oriented. Well appearing and in no acute distress. Eyes: Conjunctivae are normal. PERRL. EOMI. Head: Atraumatic. Nose: No congestion/rhinnorhea. Mouth/Throat: Mucous membranes are moist.   Neck: No stridor.   Cardiovascular: Normal rate, regular rhythm. Grossly normal heart sounds.  Good  peripheral circulation. Respiratory: Normal respiratory effort.  No retractions. Lungs CTAB. Gastrointestinal: Soft and nontender. No distention. No CVA tenderness. Genitourinary: Normal external exam without any lesions but with blood surrounding the introitus. Speculum exam with a small amount of maroon blood in the vault. Cervix is visualized and there is no active bleeding from the cervix. Bimanual exam with a closed cervical os. No CMT. No uterine or adnexal tenderness or masses. Musculoskeletal: No lower extremity tenderness nor edema.  No joint effusions. Neurologic:  Normal speech and language. No gross focal neurologic deficits are appreciated. No gait instability. Skin:   Skin is warm, dry and intact. No rash noted. Psychiatric: Mood and affect are normal. Speech and behavior are normal.  ____________________________________________   LABS (all labs ordered are listed, but only abnormal results are displayed)  Labs Reviewed  WET PREP, GENITAL  CHLAMYDIA/NGC RT PCR ([facility] ONLY)  CBC  POCT PREGNANCY, URINE   ____________________________________________  EKG   ____________________________________________  RADIOLOGY   ____________________________________________   PROCEDURES   ____________________________________________   INITIAL IMPRESSION / ASSESSMENT AND PLAN / ED COURSE  Pertinent labs & imaging results that were available during my care of the patient were reviewed by me and considered in my medical decision making (see chart for details).  ----------------------------------------- 4:54 PM on 03/08/2015 -----------------------------------------  Patient likely with dysfunctional uterine bleeding after this procedure. Has not followed up with OB/GYN. We'll give local follow-up. Labs reassuring. Likely more blood first thing in the morning secondary to pooling during the night. ____________________________________________   FINAL CLINICAL IMPRESSION(S) / ED DIAGNOSES  Dysfunctional uterine bleeding.    Myrna Blazer, MD 03/08/15 [phone]

## 2015-03-08 NOTE — ED Notes (Signed)
Patient left ER medical facility before pelvic specimen resulted from laboratory. This RN advised to stay for results, patient continues to request to leave ER medical facility. MD made aware. Patient and family given phone number to medical records at [facility].

## 2015-08-28 ENCOUNTER — Emergency Department
Admission: EM | Admit: 2015-08-28 | Discharge: 2015-08-28 | Disposition: A | Discharge status: Home / Self Care | Payer: Medicaid Other | Attending: Emergency Medicine | Admitting: Emergency Medicine

## 2015-08-28 ENCOUNTER — Encounter: Payer: Self-pay | Admitting: Emergency Medicine

## 2015-08-28 ENCOUNTER — Emergency Department: Payer: Medicaid Other

## 2015-08-28 DIAGNOSIS — O210 Mild hyperemesis gravidarum: Secondary | ICD-10-CM | POA: Insufficient documentation

## 2015-08-28 DIAGNOSIS — Z3A01 Less than 8 weeks gestation of pregnancy: Secondary | ICD-10-CM | POA: Diagnosis not present

## 2015-08-28 DIAGNOSIS — Z3490 Encounter for supervision of normal pregnancy, unspecified, unspecified trimester: Secondary | ICD-10-CM

## 2015-08-28 LAB — URINALYSIS COMPLETE WITH MICROSCOPIC (ARMC ONLY)
Bilirubin Urine: NEGATIVE
Glucose, UA: NEGATIVE mg/dL
Hgb urine dipstick: NEGATIVE
Ketones, ur: NEGATIVE mg/dL
Leukocytes, UA: NEGATIVE
Nitrite: NEGATIVE
PH: 6 (ref 5.0–8.0)
Protein, ur: NEGATIVE mg/dL
RBC / HPF: NONE SEEN RBC/hpf (ref 0–5)
Specific Gravity, Urine: 1.016 (ref 1.005–1.030)

## 2015-08-28 LAB — BASIC METABOLIC PANEL
Anion gap: 5 (ref 5–15)
BUN: 10 mg/dL (ref 6–20)
CHLORIDE: 103 mmol/L (ref 101–111)
CO2: 27 mmol/L (ref 22–32)
Calcium: 9.9 mg/dL (ref 8.9–10.3)
Creatinine, Ser: 0.72 mg/dL (ref 0.44–1.00)
GFR calc Af Amer: >60 mL/min (ref >60)
GFR calc non Af Amer: >60 mL/min (ref >60)
Glucose, Bld: 86 mg/dL (ref 65–99)
Potassium: 3.9 mmol/L (ref 3.5–5.1)
SODIUM: 135 mmol/L (ref 135–145)

## 2015-08-28 LAB — CBC
HEMATOCRIT: 37.1 % (ref 35.0–47.0)
Hemoglobin: 12.3 g/dL (ref 12.0–16.0)
MCH: 29 pg (ref 26.0–34.0)
MCHC: 33.2 g/dL (ref 32.0–36.0)
MCV: 87.4 fL (ref 80.0–100.0)
Platelets: 335 10*3/uL (ref 150–440)
RBC: 4.24 MIL/uL (ref 3.80–5.20)
RDW: 13.7 % (ref 11.5–14.5)
WBC: 9.2 10*3/uL (ref 3.6–11.0)

## 2015-08-28 LAB — HCG, QUANTITATIVE, PREGNANCY: hCG, Beta Chain, Quant, S: 79425 m[IU]/mL — ABNORMAL HIGH (ref <5)

## 2015-08-28 LAB — POCT PREGNANCY, URINE: PREG TEST UR: POSITIVE — AB

## 2015-08-28 MED ORDER — ONDANSETRON HCL 4 MG PO TABS: 4.0000 mg | ORAL_TABLET | Freq: Three times a day (TID) | ORAL | 0 refills | Status: DC | PRN

## 2015-08-28 MED ORDER — SODIUM CHLORIDE 0.9 % IV BOLUS (SEPSIS)
1000.0000 mL | Freq: Once | INTRAVENOUS | Status: AC
  Administered 2015-08-28: 1000 mL via INTRAVENOUS

## 2015-08-28 MED ORDER — ONDANSETRON HCL 4 MG/2ML IJ SOLN
4.0000 mg | Freq: Once | INTRAMUSCULAR | Status: AC
  Administered 2015-08-28: 4 mg via INTRAVENOUS

## 2015-08-28 MED ORDER — ONDANSETRON HCL 4 MG/2ML IJ SOLN
INTRAMUSCULAR | Status: AC
  Administered 2015-08-28: 4 mg via INTRAVENOUS
  Filled 2015-08-28: qty 2

## 2015-08-28 NOTE — ED Triage Notes (Signed)
Pt presents to ED with dizziness for the past several weeks. Pt states she took a pregnancy test at the onset of her symptoms and reports it was positive. (4th pregnancy) Vomiting the past several days. Also reports having a tooth pulled a few days ago and is currently taking antibiotics for the same.

## 2015-08-28 NOTE — ED Provider Notes (Signed)
Time Seen: Approximately 2054  I have reviewed the triage notes  Chief Complaint: Dizziness   History of Present Illness: Jaclyn PettiesBobbie Foss is a 25 y.o. female who describes feelings of lightheadedness. Patient states that she took a home pregnancy test which was positive. She states she's had some morning nausea and vomiting. She had this throughout most of her other pregnancies. She is now gravida 4. 3 with 3 previous deliveries. One episode of eclampsia. The patient states that she has had no diarrhea, no vaginal discharge or bleeding, no lower abdominal pain. She states that she was at work today and felt very lightheaded but overall the nausea and vomiting has occurred over the last 2 weeks   Past Medical History:  Diagnosis Date  . Anemia   . PONV (postoperative nausea and vomiting)     Patient Active Problem List   Diagnosis Date Noted  . Labor and delivery, indication for care 06/08/2014  . Labor and delivery complicated by cord around neck without compression 06/08/2014  . Vaginal delivery 06/08/2014  . Decreased fetal movement 05/28/2014    Past Surgical History:  Procedure Laterality Date  . CESAREAN SECTION      Past Surgical History:  Procedure Laterality Date  . CESAREAN SECTION      Current Outpatient Rx  . Order #: [id number] Class: Normal  . Order #: [id number] Class: Print  . Order #: [id number] Class: Historical Med    Allergies:  Penicillins  Family History: Family History  Problem Relation Age of Onset  . Cancer Sister   . Cancer Maternal Grandmother     Social History: Social History  Substance Use Topics  . Smoking status: Never Smoker  . Smokeless tobacco: Never Used  . Alcohol use No     Review of Systems:   10 point review of systems was performed and was otherwise negative:  Constitutional: No fever Eyes: No visual disturbances ENT: No sore throat, ear pain Cardiac: No chest pain Respiratory: No shortness of breath, wheezing, or  stridor Abdomen: No abdominal pain, no vomiting, No diarrhea Endocrine: No weight loss, No night sweats Extremities: No peripheral edema, cyanosis Skin: No rashes, easy bruising Neurologic: No focal weakness, trouble with speech or swollowing Urologic: No dysuria, Hematuria, or urinary frequency    Physical Exam:  ED Triage Vitals  Enc Vitals Group     BP 08/28/15 1936 139/83     Pulse Rate 08/28/15 1936 (!) 103     Resp 08/28/15 1936 18     Temp 08/28/15 1936 98.5 F (36.9 C)     Temp Source 08/28/15 1936 Oral     SpO2 08/28/15 1936 100 %     Weight 08/28/15 1938 125 lb (56.7 kg)     Height 08/28/15 1938 5\' 4"  (1.626 m)     Head Circumference --      Peak Flow --      Pain Score 08/28/15 1938 0     Pain Loc --      Pain Edu? --      Excl. in GC? --     General: Awake , Alert , and Oriented times 3; GCS 15 Head: Normal cephalic , atraumatic Eyes: Pupils equal , round, reactive to light Nose/Throat: No nasal drainage, patent upper airway without erythema or exudate.  Neck: Supple, Full range of motion, No anterior adenopathy or palpable thyroid masses Lungs: Clear to ascultation without wheezes , rhonchi, or rales Heart: Regular rate, regular rhythm without murmurs , gallops , or  rubs Abdomen: Soft, non tender without rebound, guarding , or rigidity; bowel sounds positive and symmetric in all 4 quadrants. No organomegaly .        Extremities: 2 plus symmetric pulses. No edema, clubbing or cyanosis Neurologic: normal ambulation, Motor symmetric without deficits, sensory intact Skin: warm, dry, no rashes   Labs:   All laboratory work was reviewed including any pertinent negatives or positives listed below:  Labs Reviewed  URINALYSIS COMPLETEWITH MICROSCOPIC ([facility] ONLY) - Abnormal; Notable for the following:       Result Value   Color, Urine YELLOW (*)    APPearance HAZY (*)    Bacteria, UA RARE (*)    Squamous Epithelial / LPF 0-5 (*)    All other components within  normal limits  HCG, QUANTITATIVE, PREGNANCY - Abnormal; Notable for the following:    hCG, Beta Chain, Quant, S 79,425 (*)    All other components within normal limits  POCT PREGNANCY, URINE - Abnormal; Notable for the following:    Preg Test, Ur POSITIVE (*)    All other components within normal limits  BASIC METABOLIC PANEL  CBC  CBG MONITORING, ED  POC URINE PREG, ED   Radiology:  "[place] Ob Comp Less 14 Wks  Result Date: 08/28/2015 CLINICAL DATA:  Pregnant, dizziness, lightheaded EXAM: OBSTETRIC <14 WK US AND TRANSVAGINAL OB US TECHNIQUE: Both transabdominal and transvaginal ultrasound examinations were performed for complete evaluation of the gestation as well as the maternal uterus, adnexal regions, and pelvic cul-de-sac. Transvaginal technique was performed to assess early pregnancy. COMPARISON:  None. FINDINGS: Intrauterine gestational sac: Single Yolk sac:  Present Embryo:  Present Cardiac Activity: Present Heart Rate: 135  bpm CRL:  9.3  mm   7 w   0 d                  US EDC: 04/15/2016 Subchorionic hemorrhage:  None visualized. Maternal uterus/adnexae: Bilateral ovaries are within normal limits, noting a 2.3 cm left corpus luteal cyst. No free fluid. IMPRESSION: Single live intrauterine gestation, measuring 7 weeks 0 days by crown-rump length, as above. Electronically Signed   By: Charline BillsSriyesh  Krishnan M.D.   On: 08/28/2015 21:53   [place] Ob Transvaginal  Result Date: 08/28/2015 CLINICAL DATA:  Pregnant, dizziness, lightheaded EXAM: OBSTETRIC <14 WK US AND TRANSVAGINAL OB US TECHNIQUE: Both transabdominal and transvaginal ultrasound examinations were performed for complete evaluation of the gestation as well as the maternal uterus, adnexal regions, and pelvic cul-de-sac. Transvaginal technique was performed to assess early pregnancy. COMPARISON:  None. FINDINGS: Intrauterine gestational sac: Single Yolk sac:  Present Embryo:  Present Cardiac Activity: Present Heart Rate: 135  bpm CRL:  9.3  mm   7  w   0 d                  US EDC: 04/15/2016 Subchorionic hemorrhage:  None visualized. Maternal uterus/adnexae: Bilateral ovaries are within normal limits, noting a 2.3 cm left corpus luteal cyst. No free fluid. IMPRESSION: Single live intrauterine gestation, measuring 7 weeks 0 days by crown-rump length, as above. Electronically Signed   By: Charline BillsSriyesh  Krishnan M.D.   On: 08/28/2015 21:53  "  I personally reviewed the radiologic studies    ED Course: Patient was given some IV fluids and IV Zofran. He states she's had Zofran before in the past without difficulty with her previous pregnancies. She'll be prescribed Zofran on an outpatient basis. She does not appear to be significantly dehydrated and she  does not appear to have an ectopic pregnancy. She was advised to follow up with her OB/GYN Clinical Course     Final Clinical Impression: * Near syncope Final diagnoses:  Pregnancy  Hyperemesis gravidarum     Plan:  Outpatient " New Prescriptions   ONDANSETRON (ZOFRAN) 4 MG TABLET    Take 1 tablet (4 mg total) by mouth every 8 (eight) hours as needed for nausea or vomiting.  " Patient was advised to return immediately if condition worsens. Patient was advised to follow up with their primary care physician or other specialized physicians involved in their outpatient care. The patient and/or family member/power of attorney had laboratory results reviewed at the bedside. All questions and concerns were addressed and appropriate discharge instructions were distributed by the nursing staff.            Jennye Moccasin, MD 08/28/15 2221

## 2015-08-28 NOTE — Discharge Instructions (Signed)
Please return immediately if condition worsens. Please contact her primary physician or the physician you were given for referral. If you have any specialist physicians involved in her treatment and plan please also contact them. Thank you for using [facility]. ° °

## 2015-09-20 IMAGING — US US OB FOLLOW-UP - NRPT [facility]
1 series · 14 of 28 positions shown · non-contrast
Comparison: none

[Series 1: us ob follow-up - nrpt [facility] · 0.26mm/px · 14 of 74 slices shown]
[im 3/74]
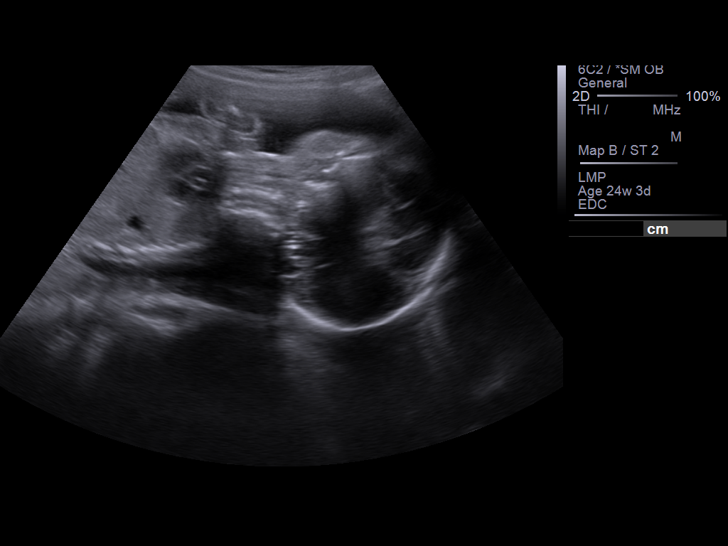
[im 9/74]
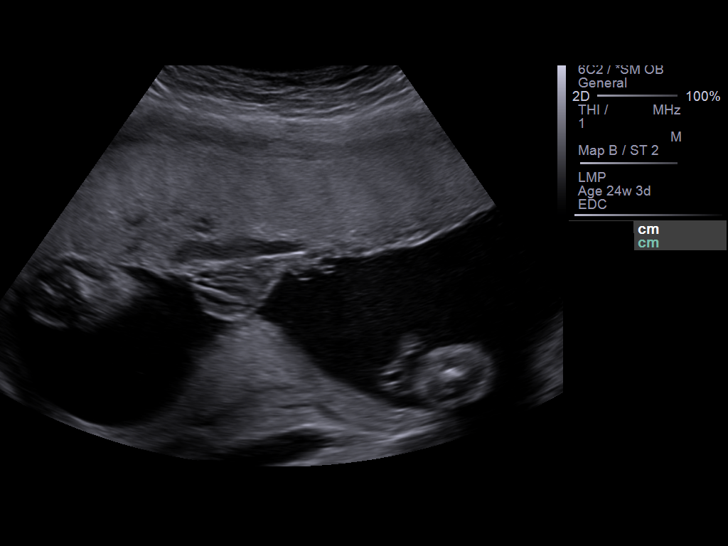
[im 14/74]
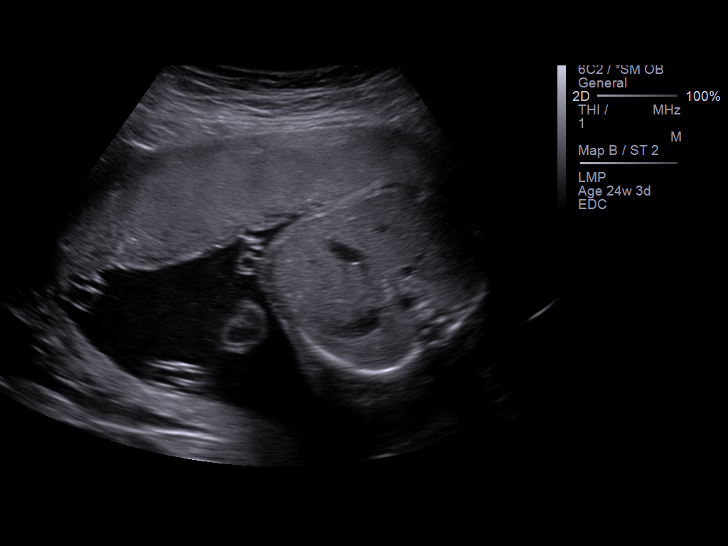
[im 19/74]
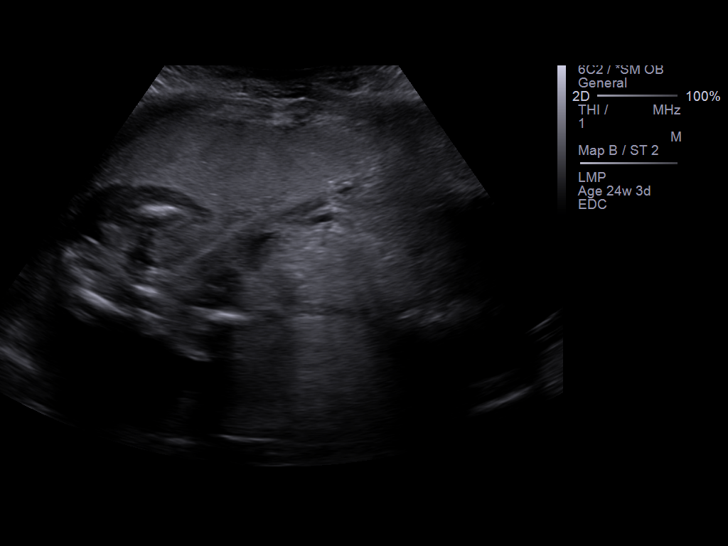
[im 25/74]
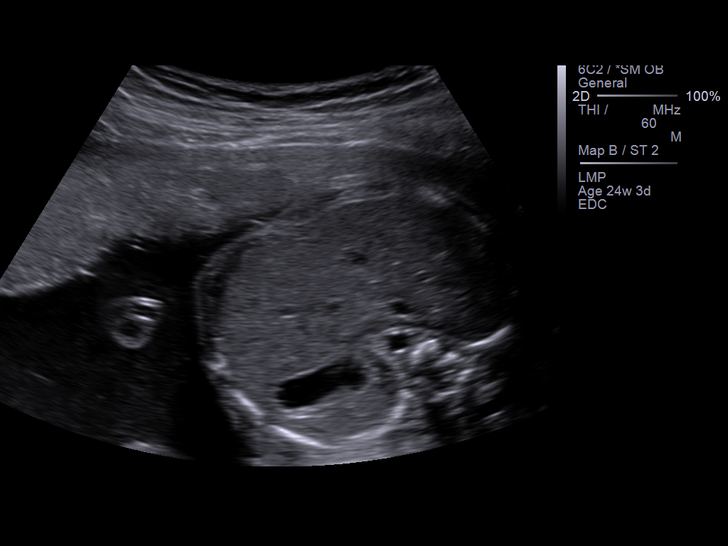
[im 30/74]
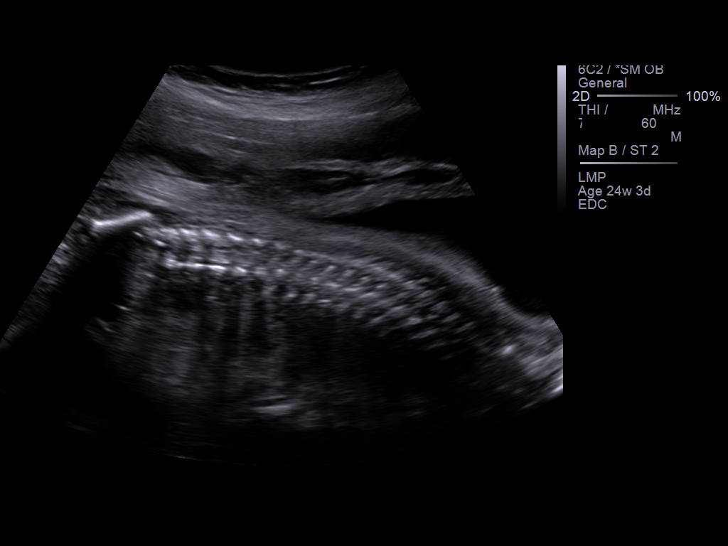
[im 36/74]
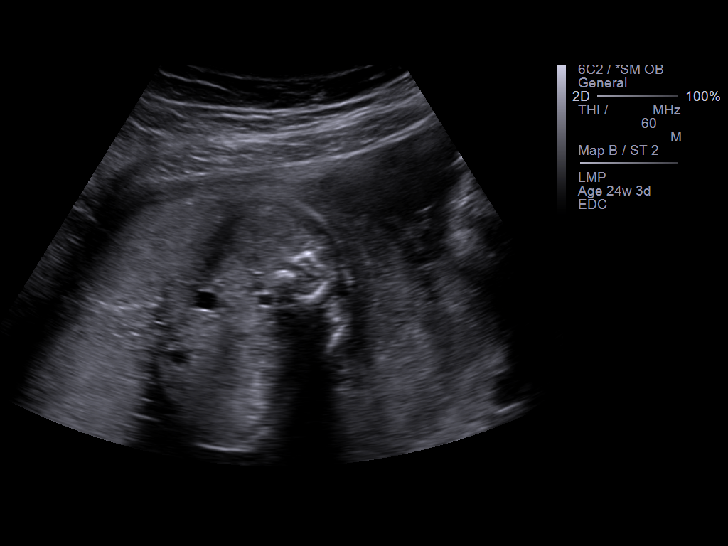
[im 41/74]
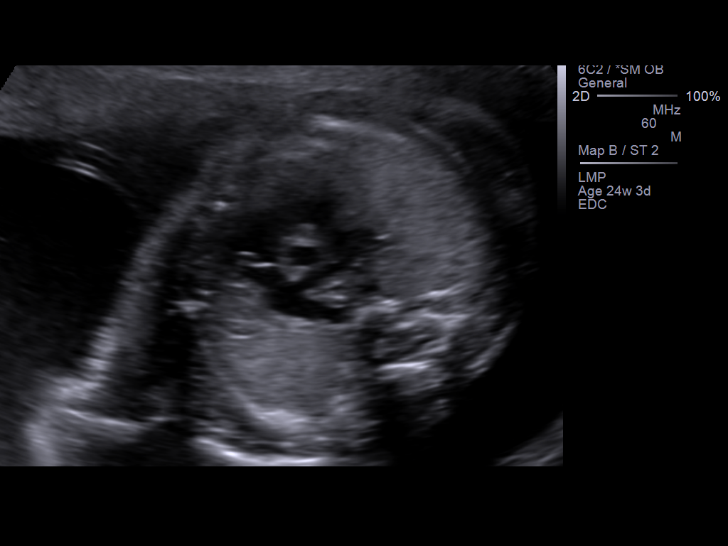
[im 46/74]
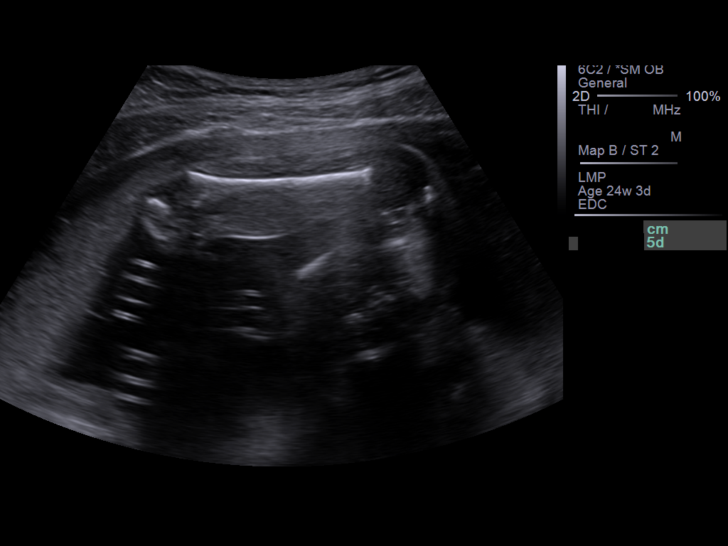
[im 52/74]
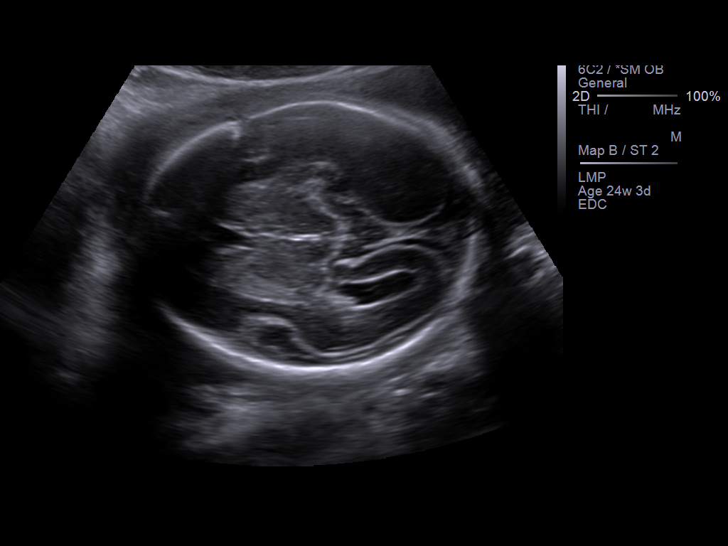
[im 57/74]
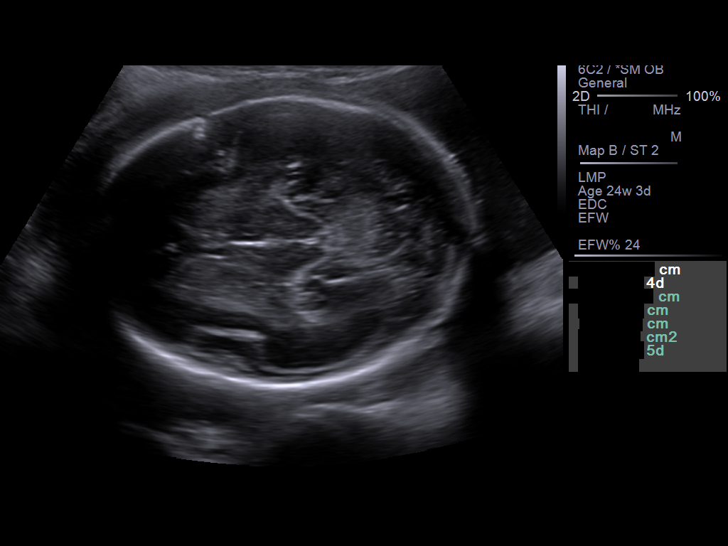
[im 63/74]
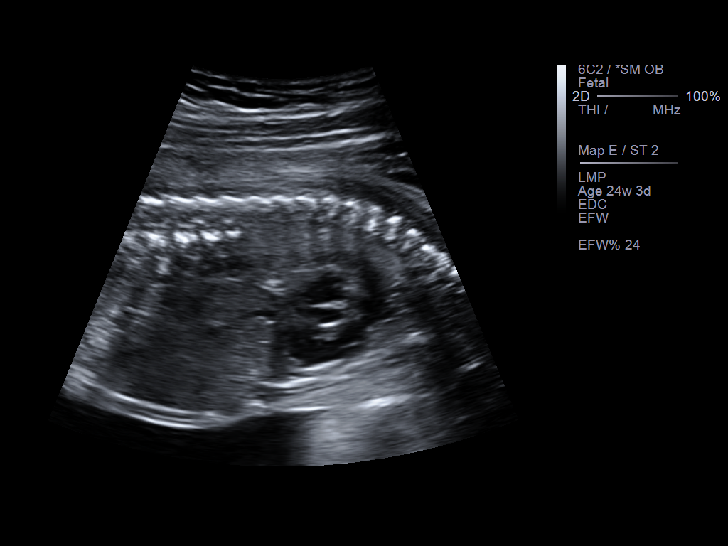
[im 68/74]
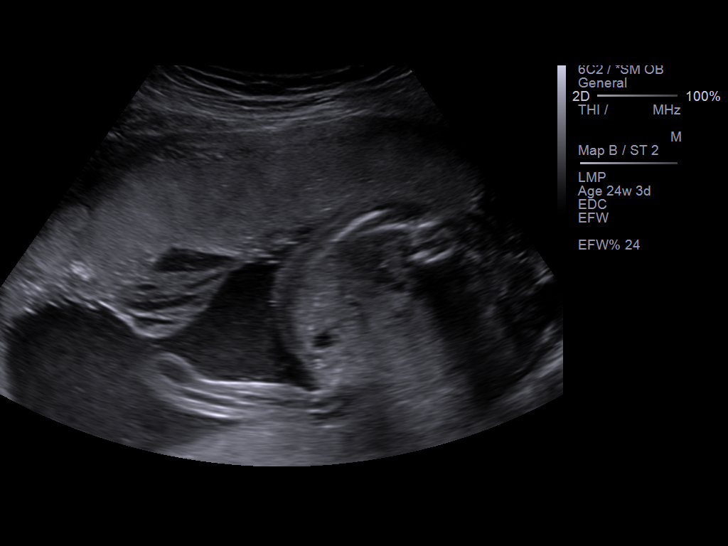
[im 74/74]
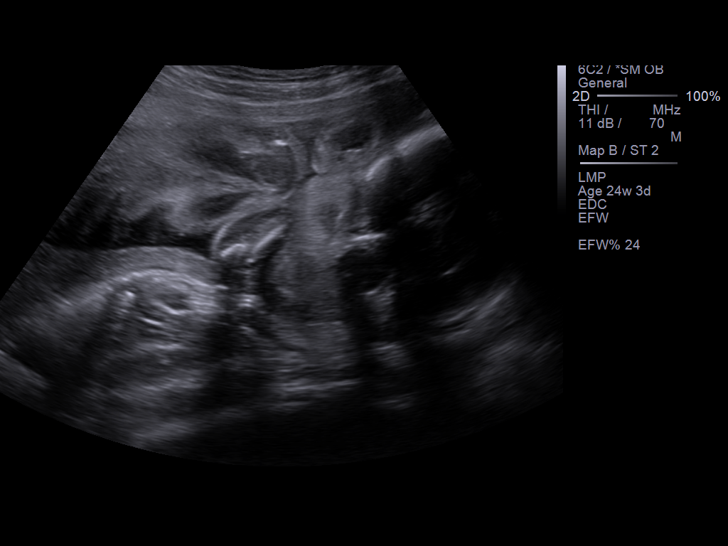

[14 of 28 positions shown; findings below may reference images not displayed]

IMAGES IMPORTED FROM THE SYNGO WORKFLOW SYSTEM
NO DICTATION FOR STUDY

## 2015-11-27 ENCOUNTER — Encounter: Payer: Self-pay | Admitting: Emergency Medicine

## 2015-11-27 ENCOUNTER — Emergency Department
Admission: EM | Admit: 2015-11-27 | Discharge: 2015-11-27 | Disposition: A | Discharge status: Home / Self Care | Payer: Medicaid Other | Attending: Emergency Medicine | Admitting: Emergency Medicine

## 2015-11-27 DIAGNOSIS — K640 First degree hemorrhoids: Secondary | ICD-10-CM | POA: Diagnosis not present

## 2015-11-27 DIAGNOSIS — K5900 Constipation, unspecified: Secondary | ICD-10-CM

## 2015-11-27 DIAGNOSIS — K625 Hemorrhage of anus and rectum: Secondary | ICD-10-CM | POA: Diagnosis present

## 2015-11-27 MED ORDER — DIBUCAINE 1 % RE OINT: 1.0000 "application " | TOPICAL_OINTMENT | Freq: Three times a day (TID) | RECTAL | 0 refills | Status: DC | PRN

## 2015-11-27 MED ORDER — HYDROCORTISONE ACETATE 25 MG RE SUPP: 25.0000 mg | Freq: Two times a day (BID) | RECTAL | 1 refills | Status: DC

## 2015-11-27 NOTE — ED Provider Notes (Signed)
[facility] Emergency Department Provider Note ____________________________________________  Time seen: 1852  I have reviewed the triage vital signs and the nursing notes.  HISTORY  Chief Complaint  Rectal Bleeding  HPI Jaclyn Austin is a [age] y.o. female presents to the ED, with her 3 small children, for evaluation of bright red blood on the toilet tissue after an attempt at bowel movement today. The patient describes being constipated for the last 5-6 days. She describes that with the patient hadn't urge to defecate, and when she did so she had pain and pressure at the rectum butpassage of stool. She did note some bright red blood on the toilet tissue after she wiped. She is also had some fullness at the rectum. She denies any nausea, vomiting, or abdominal pain. She has noted some mild bloating and some flatulence. She does have a history of hemorrhoids following childbirth, but denies any previous episodes of rectal bleeding.Patient reports she is no longer pregnant.   Past Medical History:  Diagnosis Date  . Anemia   . PONV (postoperative nausea and vomiting)     Patient Active Problem List   Diagnosis Date Noted  . Labor and delivery, indication for care 06/08/2014  . Labor and delivery complicated by cord around neck without compression 06/08/2014  . Vaginal delivery 06/08/2014  . Decreased fetal movement 05/28/2014    Past Surgical History:  Procedure Laterality Date  . CESAREAN SECTION      Prior to Admission medications   Medication Sig Start Date End Date Taking? Authorizing Provider  dibucaine (NUPERCAINAL) 1 % OINT Place 1 application rectally 3 (three) times daily as needed for pain. 11/27/15   Dia Jefferys V Bacon Idalee Foxworthy, PA-C  hydrocortisone (ANUSOL-HC) 25 MG suppository Place 1 suppository (25 mg total) rectally every 12 (twelve) hours. 11/27/15 11/26/16  Kameshia Madruga V Bacon Joby Richart, PA-C  ibuprofen (ADVIL,MOTRIN) 600 MG tablet Take 1 tablet (600 mg  total) by mouth every 6 (six) hours. 06/09/14   Melody N Shambley, CNM  ondansetron (ZOFRAN) 4 MG tablet Take 1 tablet (4 mg total) by mouth every 8 (eight) hours as needed for nausea or vomiting. 08/28/15   Jennye MoccasinBrian S Quigley, MD  Prenatal Vit-Fe Fumarate-FA (PRENATAL MULTIVITAMIN) TABS tablet Take 1 tablet by mouth daily at 12 noon.    Historical Provider, MD    Allergies Penicillins  Family History  Problem Relation Age of Onset  . Cancer Sister   . Cancer Maternal Grandmother     Social History Social History  Substance Use Topics  . Smoking status: Never Smoker  . Smokeless tobacco: Never Used  . Alcohol use No   Review of Systems  Constitutional: Negative for fever. Cardiovascular: Negative for chest pain. Respiratory: Negative for shortness of breath. Gastrointestinal: Negative for abdominal pain, vomiting and diarrhea. Reports constipation and rectal bleeding as above. Genitourinary: Negative for dysuria.  Musculoskeletal: Negative for back pain. Skin: Negative for rash. Neurological: Negative for headaches, focal weakness or numbness. ____________________________________________  PHYSICAL EXAM:  VITAL SIGNS: ED Triage Vitals  Enc Vitals Group     BP 11/27/15 1834 128/75     Pulse Rate 11/27/15 1834 (!) 103     Resp 11/27/15 1834 18     Temp 11/27/15 1834 98.7 F (37.1 C)     Temp Source 11/27/15 1834 Oral     SpO2 11/27/15 1834 97 %     Weight 11/27/15 1836 130 lb (59 kg)     Height 11/27/15 1836 5\' 4"  (1.626 m)  Head Circumference --      Peak Flow --      Pain Score 11/27/15 1837 0     Pain Loc --      Pain Edu? --      Excl. in GC? --    Constitutional: Alert and oriented. Well appearing and in no distress. Head: Normocephalic and atraumatic. Cardiovascular: Normal rate, regular rhythm. Normal distal pulses. Respiratory: Normal respiratory effort. No wheezes/rales/rhonchi. Gastrointestinal: Soft and nontender. No distention, Rebound, guarding, or  rigidity. Normal bowel sounds 4. External rectal exam shows a single flat external hemorrhoid. DRE deferred.  Musculoskeletal: Nontender with normal range of motion in all extremities.  Neurologic:  Normal gait without ataxia. Normal speech and language. No gross focal neurologic deficits are appreciated. Skin:  Skin is warm, dry and intact. No rash noted. ____________________________________________  INITIAL IMPRESSION / ASSESSMENT AND PLAN / ED COURSE  Patient with clinical presentation consistent with acute rectal bleeding due to constipation with underlying hemorrhoids.    Clinical Course    ____________________________________________  FINAL CLINICAL IMPRESSION(S) / ED DIAGNOSES  Final diagnoses:  Constipation, unspecified constipation type  Grade I hemorrhoids  Rectal bleeding      Lissa HoardJenise V Bacon Gean Laursen, PA-C 11/27/15 2100    Arnaldo NatalPaul F Malinda, MD 11/27/15 2308

## 2015-11-27 NOTE — ED Triage Notes (Signed)
Patient presents to the ED stating that she has been constipated lately and today when she had a bowel movement she noted bright red blood when she wiped.  Patient denies any other problem.  Denies history of rectal bleeding.

## 2015-11-27 NOTE — Discharge Instructions (Signed)
You have hemorrhoids which are typically worsened by constipation. Use the suppository and cream as directed. Consider increasing fiber daily. You may also consider using and OTC glycerin suppository for relief of constipation. Follow-up with your provider or [facility] for ongoing symptoms.

## 2015-11-27 NOTE — ED Notes (Signed)
Pt to ed with c/o bright red blood noted on tissue and in toilet after attempting to have a BM.  Pt states she has been constipated x 6 days.  Pt denies abd pain, denies n/v.

## 2016-01-07 NOTE — L&D Delivery Note (Signed)
2016 Called in room to see patient, complete with the urge to push. Effective maternal pushing efforts noted.   Spontaneous vaginal birth of liveborn female infant via vertex presentation in right occiput anterior position over intact perineum at 2032. Infant immediately to maternal abdomen. Delayed cord clamping. Three (3) vessel cord. Cord blood collected. APGARS: 9, 9. Weight: pending. Receiving nurse present at bedside for birth.   Spontaneous delivery of intact placenta at 2040. IV infiltrated. 10 mu IM pitocin given. Adequate relief with epidural anesthesia. Vault check completed. Count correct x 2. QBL: 160 ml.    Initiate routine postpartum care and orders. Mom to postpartum.  Baby to Couplet care / Skin to Skin.  FOB present at bedside for birth.    Gunnar Bulla, CNM 09/20/2016, 9:20 PM

## 2016-03-10 ENCOUNTER — Emergency Department
Admission: EM | Admit: 2016-03-10 | Discharge: 2016-03-10 | Disposition: A | Discharge status: Home / Self Care | Payer: Medicaid Other | Attending: Emergency Medicine | Admitting: Emergency Medicine

## 2016-03-10 ENCOUNTER — Emergency Department: Payer: Medicaid Other

## 2016-03-10 DIAGNOSIS — O208 Other hemorrhage in early pregnancy: Secondary | ICD-10-CM | POA: Diagnosis not present

## 2016-03-10 DIAGNOSIS — O418X10 Other specified disorders of amniotic fluid and membranes, first trimester, not applicable or unspecified: Secondary | ICD-10-CM

## 2016-03-10 DIAGNOSIS — Z3A11 11 weeks gestation of pregnancy: Secondary | ICD-10-CM | POA: Insufficient documentation

## 2016-03-10 DIAGNOSIS — O209 Hemorrhage in early pregnancy, unspecified: Secondary | ICD-10-CM | POA: Diagnosis present

## 2016-03-10 DIAGNOSIS — O468X1 Other antepartum hemorrhage, first trimester: Secondary | ICD-10-CM

## 2016-03-10 DIAGNOSIS — O200 Threatened abortion: Secondary | ICD-10-CM | POA: Insufficient documentation

## 2016-03-10 LAB — HCG, QUANTITATIVE, PREGNANCY: hCG, Beta Chain, Quant, S: 139878 m[IU]/mL — ABNORMAL HIGH (ref <5)

## 2016-03-10 LAB — POCT PREGNANCY, URINE: Preg Test, Ur: POSITIVE — AB

## 2016-03-10 LAB — ABO/RH: ABO/RH(D): O POS

## 2016-03-10 NOTE — ED Triage Notes (Signed)
Pt reports to ED w/ c/o possible vaginal bleeding.  Pt sts that she was running errands when she felt moisture in briefs.  Pt sts she has had bright red spotting. Pt sts she is approx 9 weeks.

## 2016-03-10 NOTE — Discharge Instructions (Signed)
You have been seen in the Emergency Department (ED) for vaginal bleeding during pregnancy, which is called a ?threatened abortion?Marland Kitchen.  Your ultrasound showed a normal pregnancy at about [redacted] weeks gestation, but it also showed a large area of bleeding between the uterus and the placenta (a subchorionic hemorrhage or hematoma).  This may get better on its own, but it may get worse and lead to a miscarriage.  Please stick to pelvic rest (no sex, nothing else placed into the vagina) and follow up with an OB provider at the next available opportunity.  Please start taking prenatal vitamins (over the counter) if you are not already doing so.  As a result of your blood type, you did not receive an injection of medication called Rhogam - please let your OB/Gyn know.  Please follow up as recommended above.  If you develop any other symptoms that concern you (including, but not limited to, persistent vomiting, worsening bleeding, abdominal or pelvic pain, or fever greater than 101), please return immediately to the Emergency Department.

## 2016-03-10 NOTE — ED Provider Notes (Signed)
[facility] Emergency Department Provider Note  ____________________________________________   First MD Initiated Contact with Patient 03/10/16 1636     (approximate)  I have reviewed the triage vital signs and the nursing notes.   HISTORY  Chief Complaint Vaginal Bleeding    HPI Jaclyn Austin is a 26 y.o. female G5 P3 Ab 1 (elective) who believes she is about [redacted] weeks pregnant who presents for evaluation of vaginal spotting starting this morning.  [place] felt some moisture in her pants and she found some bright red blood in her underwear. It was still minimal she first arrived to the emergency department but it became a flow of blood while she was in the exam room.  She is not having any pain.he denies fever/chills, chest pain, shortness of breath, nausea, vomiting.  She was tachycardic when she arrived but her tachycardia resolved by the time I saw her. her prior abortion was elective and she reports having had no complications with her other 3 children.   Past Medical History:  Diagnosis Date  . Anemia   . PONV (postoperative nausea and vomiting)     Patient Active Problem List   Diagnosis Date Noted  . Labor and delivery, indication for care 06/08/2014  . Labor and delivery complicated by cord around neck without compression 06/08/2014  . Vaginal delivery 06/08/2014  . Decreased fetal movement 05/28/2014    Past Surgical History:  Procedure Laterality Date  . CESAREAN SECTION      Prior to Admission medications   Medication Sig Start Date End Date Taking? Authorizing Provider  dibucaine (NUPERCAINAL) 1 % OINT Place 1 application rectally 3 (three) times daily as needed for pain. 11/27/15   Jenise V Bacon Menshew, PA-C  hydrocortisone (ANUSOL-HC) 25 MG suppository Place 1 suppository (25 mg total) rectally every 12 (twelve) hours. 11/27/15 11/26/16  Jenise V Bacon Menshew, PA-C  ibuprofen (ADVIL,MOTRIN) 600 MG tablet  Take 1 tablet (600 mg total) by mouth every 6 (six) hours. 06/09/14   Melody N Shambley, CNM  ondansetron (ZOFRAN) 4 MG tablet Take 1 tablet (4 mg total) by mouth every 8 (eight) hours as needed for nausea or vomiting. 08/28/15   Jennye MoccasinBrian S Quigley, MD  Prenatal Vit-Fe Fumarate-FA (PRENATAL MULTIVITAMIN) TABS tablet Take 1 tablet by mouth daily at 12 noon.    Historical Provider, MD    Allergies Penicillins  Family History  Problem Relation Age of Onset  . Cancer Sister   . Cancer Maternal Grandmother     Social History Social History  Substance Use Topics  . Smoking status: Never Smoker  . Smokeless tobacco: Never Used  . Alcohol use No    Review of Systems Constitutional: No fever/chills Eyes: No visual changes. ENT: No sore throat. Cardiovascular: Denies chest pain. Respiratory: Denies shortness of breath. Gastrointestinal: No abdominal pain.  No nausea, no vomiting.  No diarrhea.  No constipation. Genitourinary: Negative for dysuria. Musculoskeletal: Negative for back pain. Skin: Negative for rash. Neurological: Negative for headaches, focal weakness or numbness.  10-point ROS otherwise negative.  ____________________________________________   PHYSICAL EXAM:  VITAL SIGNS: ED Triage Vitals  Enc Vitals Group     BP 03/10/16 1453 138/80     Pulse Rate 03/10/16 1453 (!) 122     Resp 03/10/16 1453 18     Temp 03/10/16 1453 98.6 F (37 C)     Temp Source 03/10/16 1453 Oral     SpO2 03/10/16 1453 98 %  Weight 03/10/16 1453 150 lb (68 kg)     Height 03/10/16 1453 5\' 4"  (1.626 m)     Head Circumference --      Peak Flow --      Pain Score 03/10/16 1458 4     Pain Loc --      Pain Edu? --      Excl. in GC? --     Constitutional: Alert and oriented. Well appearing and in no acute distress. Eyes: Conjunctivae are normal. PERRL. EOMI. Head: Atraumatic. Nose: No congestion/rhinnorhea. Mouth/Throat: Mucous membranes are moist. Neck: No stridor.  No meningeal signs.    Cardiovascular: Normal rate, regular rhythm. Good peripheral circulation. Grossly normal heart sounds. Respiratory: Normal respiratory effort.  No retractions. Lungs CTAB. Gastrointestinal: Soft and nontender. No distention.  Genitourinary: normal external exam.  Moderate amount of dark blood and a few clots in her vaginal vault. the cervix was initially very difficult to visualize as it is extremely anterior, but I was able to visualize it and the cervix is closed with no products within the os.  The exam was nontender. Musculoskeletal: No lower extremity tenderness nor edema. No gross deformities of extremities. Neurologic:  Normal speech and language. No gross focal neurologic deficits are appreciated.  Skin:  Skin is warm, dry and intact. No rash noted. Psychiatric: Mood and affect are normal. Speech and behavior are normal.  ____________________________________________   LABS (all labs ordered are listed, but only abnormal results are displayed)  Labs Reviewed  HCG, QUANTITATIVE, PREGNANCY - Abnormal; Notable for the following:       Result Value   hCG, Beta Chain, Quant, [place] 161,096 (*)    All other components within normal limits  POCT PREGNANCY, URINE - Abnormal; Notable for the following:    Preg Test, Ur POSITIVE (*)    All other components within normal limits  POC URINE PREG, ED  ABO/RH   ____________________________________________  EKG  None - EKG not ordered by ED physician ____________________________________________  RADIOLOGY   US Ob Comp < 14 Wks  Result Date: 03/10/2016 CLINICAL DATA:  26 y/o  F; vaginal bleeding beginning today. EXAM: OBSTETRIC <14 WK [place] AND TRANSVAGINAL OB US TECHNIQUE: Both transabdominal and transvaginal ultrasound examinations were performed for complete evaluation of the gestation as well as the maternal uterus, adnexal regions, and pelvic cul-de-sac. Transvaginal technique was performed to assess early pregnancy. COMPARISON:  08/28/2015  pelvic ultrasound. FINDINGS: Intrauterine gestational sac: Single Yolk sac:  Not Visualized. Embryo:  Visualized. Cardiac Activity: Visualized. Heart Rate: 171  bpm CRL:  44  mm   11 w   2 d                  [place] EDC: 09/27/2016 Subchorionic hemorrhage: Large heterogeneous hypoechoic area around the gestational sac probably representing a subchorionic hemorrhage. Maternal uterus/adnexae: Right-sided corpus luteum. IMPRESSION: 1. Single live intrauterine pregnancy. Estimated gestational age of [redacted] weeks and 2 days. 2. Large subchorionic hemorrhage. Electronically Signed   By: Mitzi Hansen M.D.   On: 03/10/2016 18:07   US Ob Transvaginal  Result Date: 03/10/2016 CLINICAL DATA:  26 y/o  F; vaginal bleeding beginning today. EXAM: OBSTETRIC <14 WK [place] AND TRANSVAGINAL OB US TECHNIQUE: Both transabdominal and transvaginal ultrasound examinations were performed for complete evaluation of the gestation as well as the maternal uterus, adnexal regions, and pelvic cul-de-sac. Transvaginal technique was performed to assess early pregnancy. COMPARISON:  08/28/2015 pelvic ultrasound. FINDINGS: Intrauterine gestational sac: Single Yolk sac:  Not  Visualized. Embryo:  Visualized. Cardiac Activity: Visualized. Heart Rate: 171  bpm CRL:  44  mm   11 w   2 d                  [place] EDC: 09/27/2016 Subchorionic hemorrhage: Large heterogeneous hypoechoic area around the gestational sac probably representing a subchorionic hemorrhage. Maternal uterus/adnexae: Right-sided corpus luteum. IMPRESSION: 1. Single live intrauterine pregnancy. Estimated gestational age of [redacted] weeks and 2 days. 2. Large subchorionic hemorrhage. Electronically Signed   By: Mitzi Hansen M.D.   On: 03/10/2016 18:07    ____________________________________________   PROCEDURES  Procedure(s) performed:   Procedures   Critical Care performed: No ____________________________________________   INITIAL IMPRESSION / ASSESSMENT AND PLAN / ED  COURSE  Pertinent labs & imaging results that were available during my care of the patient were reviewed by me and considered in my medical decision making (see chart for details).  very concerning for ongoing spontaneous abortion given the amount of blood seen in the vagina.  I will proceed with ABO/Rh, ultrasound, reassess.      ____________________________________________  FINAL CLINICAL IMPRESSION(S) / ED DIAGNOSES  Final diagnoses:  Threatened miscarriage  Subchorionic hemorrhage of placenta in first trimester, single or unspecified fetus     MEDICATIONS GIVEN DURING THIS VISIT:  Medications - No data to display   NEW OUTPATIENT MEDICATIONS STARTED DURING THIS VISIT:  New Prescriptions   No medications on file    Modified Medications   No medications on file    Discontinued Medications   No medications on file     Note:  This document was prepared using Dragon voice recognition software and may include unintentional dictation errors.    Loleta Rose, MD 03/10/16 [phone]

## 2016-03-10 NOTE — ED Notes (Signed)
Pt returned from US

## 2016-03-12 ENCOUNTER — Encounter: Payer: Self-pay | Admitting: Obstetrics & Gynecology

## 2016-03-12 ENCOUNTER — Ambulatory Visit (INDEPENDENT_AMBULATORY_CARE_PROVIDER_SITE_OTHER): Payer: Medicaid Other | Admitting: Obstetrics & Gynecology

## 2016-03-12 VITALS — BP 100/70 | HR 106 | Wt 144.0 lb

## 2016-03-12 DIAGNOSIS — O209 Hemorrhage in early pregnancy, unspecified: Secondary | ICD-10-CM

## 2016-03-12 DIAGNOSIS — Z3A10 10 weeks gestation of pregnancy: Secondary | ICD-10-CM

## 2016-03-12 NOTE — Progress Notes (Signed)
Obstetric Problem Visit   Chief Complaint:  Chief Complaint  Patient presents with  . er f/up    bleeding with pregnancy    History of Present Illness: Patient is a 26 y.o. [id number] presenting for first trimester bleeding.  The onset of bleeding was 3 days ago without an inciting event.  Seen in ER w normal US/FHTs; preg at 10 weeks.  Is bleeding equal to or greater than normal menstrual flow:  No Any recent trauma:  No Recent intercourse:  No History of prior miscarriage:  No Prior ultrasound demonstrating IUP:  Yes Prior ultrasound demonstrating viable IUP:  Yes Prior Serum HCG:  No Rh status: O+  PMHx: She  has a past medical history of Anemia and PONV (postoperative nausea and vomiting). Also,  has a past surgical history that includes Cesarean section., family history includes Cancer in her maternal grandmother and sister.,  reports that she has never smoked. She has never used smokeless tobacco. She reports that she does not drink alcohol or use drugs.  She has a current medication list which includes the following prescription(s): ondansetron. Also, is allergic to penicillins.  Review of Systems  Constitutional: Negative for chills, fever and malaise/fatigue.  HENT: Negative for congestion, sinus pain and sore throat.   Eyes: Negative for blurred vision and pain.  Respiratory: Negative for cough and wheezing.   Cardiovascular: Negative for chest pain and leg swelling.  Gastrointestinal: Negative for abdominal pain, constipation, diarrhea, heartburn, nausea and vomiting.  Genitourinary: Negative for dysuria, frequency, hematuria and urgency.  Musculoskeletal: Negative for back pain, joint pain, myalgias and neck pain.  Skin: Negative for itching and rash.  Neurological: Negative for dizziness, tremors and weakness.  Endo/Heme/Allergies: Does not bruise/bleed easily.  Psychiatric/Behavioral: Negative for depression. The patient is not nervous/anxious and does not have  insomnia.     Objective: Vitals:   03/12/16 1027  BP: 100/70  Pulse: (!) 106   Physical Exam  Constitutional: She is oriented to person, place, and time. She appears well-developed and well-nourished. No distress.  Genitourinary: Rectum normal, vagina normal and uterus normal. Pelvic exam was performed with patient supine. There is no rash or lesion on the right labia. There is no rash or lesion on the left labia. Vagina exhibits no lesion. No bleeding in the vagina. Right adnexum does not display mass and does not display tenderness. Left adnexum does not display mass and does not display tenderness. Cervix does not exhibit motion tenderness, lesion, friability or polyp.   Uterus is mobile and midaxial. Uterus is not enlarged or exhibiting a mass.  HENT:  Head: Normocephalic and atraumatic. Head is without laceration.  Right Ear: Hearing normal.  Left Ear: Hearing normal.  Nose: No epistaxis.  No foreign bodies.  Mouth/Throat: Uvula is midline, oropharynx is clear and moist and mucous membranes are normal.  Eyes: Pupils are equal, round, and reactive to light.  Neck: Normal range of motion. Neck supple. No thyromegaly present.  Cardiovascular: Normal rate and regular rhythm.  Exam reveals no gallop and no friction rub.   No murmur heard. Pulmonary/Chest: Effort normal and breath sounds normal. No respiratory distress. She has no wheezes.  Abdominal: Soft. Bowel sounds are normal. She exhibits no distension. There is no tenderness. There is no rebound.  Musculoskeletal: Normal range of motion.  Neurological: She is alert and oriented to person, place, and time. No cranial nerve deficit.  Skin: Skin is warm and dry.  Psychiatric: She has a normal mood and  affect. Judgment normal.  Vitals reviewed.   Assessment: 26 y.o. [id number] [redacted]w[redacted]d No problem-specific Assessment & Plan notes found for this encounter.   Plan: Problem List Items Addressed This Visit      Other   First trimester  bleeding - Primary      1) First trimester bleeding - incidence and clinical course of first trimester bleeding is discussed in detail with the patient today.  Approximately 1/3 of pregnancies ending in live births experienced 1st trimester bleeding.  The amount of bleeding is variable and not necessarily predictive of outcome.  Sources may be cervical or uterine.  Subchorionic hemorrhages are a frequent concurrent findings on ultrasound and are followed expectantly.  These often absorb or regress spontaneously although risk for expansion and further disruption of the utero-placental interface leading to miscarriage is possible.  There is no clearly documented benefit to limiting or modifying activity and sexual intercourse in altering clinic course of 1st trimester bleeding.    2) If not already done will proceed with TVUS evaluation to document viability, and if uncertain viability or absence of a demonstrable IUP (and no previous documentation of IUP) will trend HCG levels.  3) The patient is Rh O+, rhogam is therefore not indicated to decrease the risk rhesus alloimmunization.    4) Routine bleeding precautions were discussed with the patient prior the conclusion of today's visit.

## 2016-03-14 LAB — GC/CHLAMYDIA PROBE AMP
Chlamydia trachomatis, NAA: NEGATIVE
NEISSERIA GONORRHOEAE BY PCR: NEGATIVE

## 2016-03-28 ENCOUNTER — Encounter: Payer: Self-pay | Admitting: Obstetrics & Gynecology

## 2016-03-30 ENCOUNTER — Emergency Department: Payer: Medicaid Other

## 2016-03-30 ENCOUNTER — Emergency Department
Admission: EM | Admit: 2016-03-30 | Discharge: 2016-03-30 | Disposition: A | Discharge status: Home / Self Care | Payer: Medicaid Other | Attending: Emergency Medicine | Admitting: Emergency Medicine

## 2016-03-30 ENCOUNTER — Encounter: Payer: Self-pay | Admitting: Emergency Medicine

## 2016-03-30 DIAGNOSIS — O4690 Antepartum hemorrhage, unspecified, unspecified trimester: Secondary | ICD-10-CM

## 2016-03-30 DIAGNOSIS — O468X2 Other antepartum hemorrhage, second trimester: Secondary | ICD-10-CM

## 2016-03-30 DIAGNOSIS — O418X20 Other specified disorders of amniotic fluid and membranes, second trimester, not applicable or unspecified: Secondary | ICD-10-CM

## 2016-03-30 DIAGNOSIS — O200 Threatened abortion: Secondary | ICD-10-CM | POA: Diagnosis not present

## 2016-03-30 DIAGNOSIS — O208 Other hemorrhage in early pregnancy: Secondary | ICD-10-CM | POA: Diagnosis not present

## 2016-03-30 DIAGNOSIS — Z3A14 14 weeks gestation of pregnancy: Secondary | ICD-10-CM | POA: Insufficient documentation

## 2016-03-30 DIAGNOSIS — O209 Hemorrhage in early pregnancy, unspecified: Secondary | ICD-10-CM | POA: Diagnosis present

## 2016-03-30 LAB — POCT PREGNANCY, URINE: Preg Test, Ur: POSITIVE — AB

## 2016-03-30 LAB — CBC WITH DIFFERENTIAL/PLATELET
Basophils Absolute: 0 10*3/uL (ref 0–0.1)
Basophils Relative: 0 %
Eosinophils Absolute: 0.1 10*3/uL (ref 0–0.7)
Eosinophils Relative: 1 %
HCT: 35.2 % (ref 35.0–47.0)
HEMOGLOBIN: 11.8 g/dL — AB (ref 12.0–16.0)
LYMPHS ABS: 2.7 10*3/uL (ref 1.0–3.6)
LYMPHS PCT: 36 %
MCH: 29.1 pg (ref 26.0–34.0)
MCHC: 33.5 g/dL (ref 32.0–36.0)
MCV: 86.8 fL (ref 80.0–100.0)
Monocytes Absolute: 0.7 10*3/uL (ref 0.2–0.9)
Monocytes Relative: 9 %
NEUTROS ABS: 4.1 10*3/uL (ref 1.4–6.5)
NEUTROS PCT: 54 %
Platelets: 301 10*3/uL (ref 150–440)
RBC: 4.06 MIL/uL (ref 3.80–5.20)
RDW: 14.8 % — ABNORMAL HIGH (ref 11.5–14.5)
WBC: 7.5 10*3/uL (ref 3.6–11.0)

## 2016-03-30 LAB — HCG, QUANTITATIVE, PREGNANCY: HCG, BETA CHAIN, QUANT, S: 88493 m[IU]/mL — AB (ref <5)

## 2016-03-30 NOTE — ED Provider Notes (Signed)
[facility] Emergency Department Provider Note   ____________________________________________   First MD Initiated Contact with Patient 03/30/16 930 [phone]     (approximate)  I have reviewed the triage vital signs and the nursing notes.   HISTORY  Chief Complaint Vaginal Bleeding    HPI Jaclyn Austin is a 26 y.o. female G5P3Ab1 approximately [redacted] weeks pregnant who presents to the ED from home with a chief complaint of vaginal bleeding and pelvic cramping.Patient was seen in the ED 3/5 for same; had ultrasound demonstrating IUP at 11 weeks plus subchorionic hematoma. Patient reports bleeding improved; noted bleeding again last evening. Symptoms associated with pelvic cramping. Last sexual intercourse over one week ago. Denies associated fever, chills, chest pain, shortness of breath, nausea, vomiting, dysuria. Denies recent travel or trauma. Nothing makes her symptoms better or worse.   Past Medical History:  Diagnosis Date  . Anemia   . PONV (postoperative nausea and vomiting)     Patient Active Problem List   Diagnosis Date Noted  . First trimester bleeding 03/12/2016  . Labor and delivery, indication for care 06/08/2014  . Labor and delivery complicated by cord around neck without compression 06/08/2014  . Vaginal delivery 06/08/2014  . Decreased fetal movement 05/28/2014    Past Surgical History:  Procedure Laterality Date  . CESAREAN SECTION      Prior to Admission medications   Medication Sig Start Date End Date Taking? Authorizing Provider  ondansetron (ZOFRAN) 4 MG tablet Take 1 tablet (4 mg total) by mouth every 8 (eight) hours as needed for nausea or vomiting. 08/28/15   Jennye MoccasinBrian S Quigley, MD    Allergies Penicillins  Family History  Problem Relation Age of Onset  . Cancer Sister   . Cancer Maternal Grandmother     Social History Social History  Substance Use Topics  . Smoking status: Never Smoker  . Smokeless tobacco: Never Used  .  Alcohol use No    Review of Systems  Constitutional: No fever/chills. Eyes: No visual changes. ENT: No sore throat. Cardiovascular: Denies chest pain. Respiratory: Denies shortness of breath. Gastrointestinal: No abdominal pain.  No nausea, no vomiting.  No diarrhea.  No constipation. Genitourinary: Positive for vaginal bleeding. Negative for dysuria. Musculoskeletal: Negative for back pain. Skin: Negative for rash. Neurological: Negative for headaches, focal weakness or numbness.  10-point ROS otherwise negative.  ____________________________________________   PHYSICAL EXAM:  VITAL SIGNS: ED Triage Vitals [03/30/16 0259]  Enc Vitals Group     BP (!) 193/100     Pulse Rate 98     Resp 18     Temp 98.1 F (36.7 C)     Temp Source Oral     SpO2 100 %     Weight 145 lb (65.8 kg)     Height 5\' 4"  (1.626 m)     Head Circumference      Peak Flow      Pain Score 5     Pain Loc      Pain Edu?      Excl. in GC?      Constitutional: Alert and oriented. Well appearing and in no acute distress. Eyes: Conjunctivae are normal. PERRL. EOMI. Head: Atraumatic. Nose: No congestion/rhinnorhea. Mouth/Throat: Mucous membranes are moist.  Oropharynx non-erythematous. Neck: No stridor.   Cardiovascular: Normal rate, regular rhythm. Grossly normal heart sounds.  Good peripheral circulation. Respiratory: Normal respiratory effort.  No retractions. Lungs CTAB. Gastrointestinal: Soft and nontender to light and deep palpation. No distention. No  abdominal bruits. No CVA tenderness. Musculoskeletal: No lower extremity tenderness nor edema.  No joint effusions. Neurologic:  Normal speech and language. No gross focal neurologic deficits are appreciated. No gait instability. Skin:  Skin is warm, dry and intact. No rash noted. Psychiatric: Mood and affect are normal. Speech and behavior are normal.  ____________________________________________   LABS (all labs ordered are listed, but only  abnormal results are displayed)  Labs Reviewed  CBC WITH DIFFERENTIAL/PLATELET - Abnormal; Notable for the following:       Result Value   Hemoglobin 11.8 (*)    RDW 14.8 (*)    All other components within normal limits  HCG, QUANTITATIVE, PREGNANCY - Abnormal; Notable for the following:    hCG, Beta Chain, Quant, S [id number] (*)    All other components within normal limits  POCT PREGNANCY, URINE - Abnormal; Notable for the following:    Preg Test, Ur POSITIVE (*)    All other components within normal limits   ____________________________________________  EKG  None ____________________________________________  RADIOLOGY  Ultrasound interpreted per Dr. Gwenyth Bender: 1. Single live intrauterine pregnancy with an estimated gestational  age of [redacted] weeks, 3 days based on today's measurements.  2. Moderate-sized perigestational hemorrhage, decreased in size  compared to the prior ultrasound.   ____________________________________________   PROCEDURES  Procedure(s) performed:   Pelvic exam: External exam WNL without rashes, lesions or vesicles. Speculum exam reveals mild old blood; no bright red or active bleeding. Bimanual exam WNL.  Procedures  Critical Care performed: No  ____________________________________________   INITIAL IMPRESSION / ASSESSMENT AND PLAN / ED COURSE  Pertinent labs & imaging results that were available during my care of the patient were reviewed by me and considered in my medical decision making (see chart for details).  26 year old female approximately 14 weeks by ultrasound dated 3/5 who presents with rebleeding. Will obtain screening lab work and repeat ultrasounds to evaluate subchorionic hemorrhage. Patient is O+ blood type.  Clinical Course as of Mar 31 711  Sun Mar 30, 2016  0709 Updated patient of ultrasound result. Strict return precautions given. Patient verbalizes understanding and agrees with plan of care.  [JS]    Clinical Course User  Index [JS] Irean Hong, MD     ____________________________________________   FINAL CLINICAL IMPRESSION(S) / ED DIAGNOSES  Final diagnoses:  Threatened miscarriage  Subchorionic hematoma in second trimester, single or unspecified fetus      NEW MEDICATIONS STARTED DURING THIS VISIT:  New Prescriptions   No medications on file     Note:  This document was prepared using Dragon voice recognition software and may include unintentional dictation errors.    Irean Hong, MD 03/30/16 [phone]

## 2016-03-30 NOTE — ED Notes (Signed)
Patient transported to Ultrasound 

## 2016-03-30 NOTE — ED Triage Notes (Signed)
Patient states that she is about [redacted] weeks pregnant and started having vaginal bleeding and abdominal cramping yesterday. Patient states that the bleeding became heavier today.

## 2016-03-30 NOTE — Discharge Instructions (Signed)
1. Do not use tampons, douche or have sexual intercourse until seen by your doctor. 2. Return to the ER for worsening symptoms, persistent vomiting, difficulty breathing, soaking more than 1 pad per hour, fainting or other concerns.

## 2016-04-02 ENCOUNTER — Other Ambulatory Visit: Payer: Self-pay | Admitting: Obstetrics & Gynecology

## 2016-04-02 ENCOUNTER — Ambulatory Visit (INDEPENDENT_AMBULATORY_CARE_PROVIDER_SITE_OTHER): Payer: Medicaid Other

## 2016-04-02 ENCOUNTER — Ambulatory Visit (INDEPENDENT_AMBULATORY_CARE_PROVIDER_SITE_OTHER): Payer: Medicaid Other | Admitting: Advanced Practice Midwife

## 2016-04-02 VITALS — BP 112/72 | Wt 146.0 lb

## 2016-04-02 DIAGNOSIS — O0990 Supervision of high risk pregnancy, unspecified, unspecified trimester: Secondary | ICD-10-CM | POA: Diagnosis not present

## 2016-04-02 DIAGNOSIS — Z8759 Personal history of other complications of pregnancy, childbirth and the puerperium: Secondary | ICD-10-CM

## 2016-04-02 DIAGNOSIS — O209 Hemorrhage in early pregnancy, unspecified: Secondary | ICD-10-CM

## 2016-04-02 DIAGNOSIS — Z3A14 14 weeks gestation of pregnancy: Secondary | ICD-10-CM

## 2016-04-02 DIAGNOSIS — Z98891 History of uterine scar from previous surgery: Secondary | ICD-10-CM | POA: Insufficient documentation

## 2016-04-02 DIAGNOSIS — Z1379 Encounter for other screening for genetic and chromosomal anomalies: Secondary | ICD-10-CM

## 2016-04-02 DIAGNOSIS — Z113 Encounter for screening for infections with a predominantly sexual mode of transmission: Secondary | ICD-10-CM

## 2016-04-02 NOTE — Progress Notes (Signed)
Dating scan today.  

## 2016-04-02 NOTE — Patient Instructions (Signed)

## 2016-04-03 NOTE — Progress Notes (Signed)
New Obstetric Patient H&P    Chief Complaint: "Desires prenatal care"   History of Present Illness: Patient is a 26 y.o. [id number] Not Hispanic or Latino female, LMP 12/27/2015 presents with amenorrhea and positive home pregnancy test. Based on her  LMP, her EDD is Estimated Date of Delivery: 09/30/16 and her EGA is [redacted]w[redacted]d. Cycles are 6. days, regular, and occur approximately every : 28 days. Her last pap smear was 2 months ago and was abnormal but unsure of results.    She had a urine pregnancy test which was positive 4 week(s)  ago. Her last menstrual period was normal and lasted for  6 day(s). Since her LMP she claims she has experienced breast tenderness, fatigue, nausea, vomiting. She denies vaginal bleeding. Her past medical history is noncontributory. Her prior pregnancies are notable for pre-eclampsia with G1, c/section with G2, vaginal birth with G3  Since her LMP, she admits to the use of tobacco products  no She claims she has gained   20 pounds since the start of her pregnancy.  There are cats in the home in the home  no  She admits close contact with children on a regular basis  yes  She has had chicken pox in the past yes She has had Tuberculosis exposures, symptoms, or previously tested positive for TB   no Current or past history of domestic violence. no  Genetic Screening/Teratology Counseling: (Includes patient, baby's father, or anyone in either family with:)   1. Patient's age >/= 47 at [facility]  no 2. Thalassemia ([place], [place], Mediterranean, or Asian background): MCV<80  no 3. Neural tube defect (meningomyelocele, spina bifida, anencephaly)  no 4. Congenital heart defect  no  5. Down syndrome  no 6. Tay-Sachs (Jewish, [place]  no 7. Canavan's Disease  no 8. Sickle cell disease or trait (African)  no  9. Hemophilia or other blood disorders  no  10. Muscular dystrophy  no  11. Cystic fibrosis  no  12. Huntington's Chorea  no  13. Mental  retardation/autism  no 14. Other inherited genetic or chromosomal disorder  no 15. Maternal metabolic disorder (DM, PKU, etc)  no 16. Patient or FOB with a child with a birth defect not listed above no  16a. Patient or FOB with a birth defect themselves no 17. Recurrent pregnancy loss, or stillbirth  no  18. Any medications since LMP other than prenatal vitamins (include vitamins, supplements, OTC meds, drugs, alcohol)  no 19. Any other genetic/environmental exposure to discuss  no  Infection History:   1. Lives with someone with TB or TB exposed  no  2. Patient or partner has history of genital herpes  no 3. Rash or viral illness since LMP  no 4. History of STI (GC, CT, HPV, syphilis, HIV)  no 5. History of recent travel :  no  Other pertinent information:  no     Review of Systems:10 point review of systems negative unless otherwise noted in HPI  Past Medical History:  Past Medical History:  Diagnosis Date  . Anemia   . PONV (postoperative nausea and vomiting)     Past Surgical History:  Past Surgical History:  Procedure Laterality Date  . CESAREAN SECTION      Gynecologic History: Patient's last menstrual period was 12/27/2015.  Obstetric History: [id number]  Family History:  Family History  Problem Relation Age of Onset  . Cancer Sister   . Cancer Maternal Grandmother     Social  History:  Social History   Social History  . Marital status: Single    Spouse name: N/A  . Number of children: N/A  . Years of education: N/A   Occupational History  . Not on file.   Social History Main Topics  . Smoking status: Never Smoker  . Smokeless tobacco: Never Used  . Alcohol use No  . Drug use: No  . Sexual activity: Not on file   Other Topics Concern  . Not on file   Social History Narrative  . No narrative on file    Allergies:  Allergies  Allergen Reactions  . Penicillins Hives and Rash    Medications: Prior to Admission medications   Medication  Sig Start Date End Date Taking? Authorizing Provider  hydrocortisone 2.5 % ointment apply to ABDOMEN twice a day 03/27/16   Historical Provider, MD  ondansetron (ZOFRAN) 4 MG tablet Take 1 tablet (4 mg total) by mouth every 8 (eight) hours as needed for nausea or vomiting. 08/28/15   Jennye MoccasinBrian S Quigley, MD  ondansetron (ZOFRAN-ODT) 4 MG disintegrating tablet Take 4 mg by mouth 3 (three) times daily. 03/26/16   Historical Provider, MD    Physical Exam Vitals: Blood pressure 112/72, weight 146 lb (66.2 kg), last menstrual period 12/27/2015, unknown if currently breastfeeding.  General: NAD HEENT: normocephalic, anicteric Thyroid: no enlargement, no palpable nodules Pulmonary: No increased work of breathing, CTAB Cardiovascular: RRR, distal pulses 2+ Abdomen: NABS, soft, non-tender, non-distended.  Umbilicus without lesions.  No hepatomegaly, splenomegaly or masses palpable. No evidence of hernia  Genitourinary:  External: Normal external female genitalia.  Normal urethral meatus, normal  Bartholin's and Skene's glands.    Vagina: Normal vaginal mucosa, no evidence of prolapse.    Cervix: Grossly normal in appearance, no bleeding, no CMT  Uterus: Enlarged, mobile, normal contour.    Adnexa: ovaries non-enlarged, no adnexal masses  Rectal: deferred Extremities: no edema, erythema, or tenderness Neurologic: Grossly intact Psychiatric: mood appropriate, affect full   Assessment: 26 y.o. [id number] at 8440w2d presenting to initiate prenatal care  Plan: 1) Avoid alcoholic beverages. 2) Patient encouraged not to smoke.  3) Discontinue the use of all non-medicinal drugs and chemicals.  4) Take prenatal vitamins daily.  5) Nutrition, food safety (fish, cheese advisories, and high nitrite foods) and exercise discussed. 6) Hospital and practice style discussed with cross coverage system.  7) Genetic Screening, such as with 1st Trimester Screening, cell free fetal DNA, AFP testing, and Ultrasound, as well  as with amniocentesis and CVS as appropriate, is discussed with patient. At the conclusion of today's visit patient requested genetic testing 8) Patient is asked about travel to areas at risk for the Zika virus, and counseled to avoid travel and exposure to mosquitoes or sexual partners who may have themselves been exposed to the virus. Testing is discussed, and will be ordered as appropriate.    Tresea MallJane Adarsh Mundorf, CNM

## 2016-04-03 NOTE — Progress Notes (Signed)
Here for NOB- dating scan today- size equals dates, no change in EDD. Note on U/S says amniotic fluid looks unclear. [facility]

## 2016-04-30 ENCOUNTER — Ambulatory Visit (INDEPENDENT_AMBULATORY_CARE_PROVIDER_SITE_OTHER): Payer: Medicaid Other | Admitting: Advanced Practice Midwife

## 2016-04-30 VITALS — BP 110/60 | Wt 155.0 lb

## 2016-04-30 DIAGNOSIS — Z3A18 18 weeks gestation of pregnancy: Secondary | ICD-10-CM

## 2016-04-30 DIAGNOSIS — Z363 Encounter for antenatal screening for malformations: Secondary | ICD-10-CM

## 2016-04-30 NOTE — Progress Notes (Signed)
Having nausea and vomiting. Sample of diclegis given. Wakes up with headaches. She is taking tylenol with some relief. Reminded to stay hydrated and eat protein to keep blood sugar steady. Discussed possibility of fioricet for intractable headaches. Anatomy scan in 2 weeks.

## 2016-05-15 ENCOUNTER — Telehealth: Payer: Self-pay

## 2016-05-15 MED ORDER — PRENATE MINI 18-0.6-0.4-350 MG PO CAPS: 1.0000 | ORAL_CAPSULE | Freq: Every day | ORAL | 11 refills | Status: DC

## 2016-05-15 MED ORDER — DOXYLAMINE-PYRIDOXINE 10-10 MG PO TBEC: 2.0000 | DELAYED_RELEASE_TABLET | Freq: Every day | ORAL | 3 refills | Status: DC

## 2016-05-15 NOTE — Telephone Encounter (Signed)
Pt calling.  She was given samples for n/v.  Is out of samples and starting to get sick again.  Would like rx.  Also would like rx for prenate mini pnv.  [phone].

## 2016-05-15 NOTE — Telephone Encounter (Signed)
Can you let Karen KitchensBobbie know that the Rx's have been sent. Thanks

## 2016-05-19 ENCOUNTER — Ambulatory Visit (INDEPENDENT_AMBULATORY_CARE_PROVIDER_SITE_OTHER): Payer: Medicaid Other | Admitting: Certified Nurse Midwife

## 2016-05-19 ENCOUNTER — Ambulatory Visit (INDEPENDENT_AMBULATORY_CARE_PROVIDER_SITE_OTHER): Payer: Medicaid Other

## 2016-05-19 VITALS — BP 122/56 | Wt 160.0 lb

## 2016-05-19 DIAGNOSIS — Z363 Encounter for antenatal screening for malformations: Secondary | ICD-10-CM | POA: Diagnosis not present

## 2016-05-19 DIAGNOSIS — Z3A20 20 weeks gestation of pregnancy: Secondary | ICD-10-CM

## 2016-05-19 DIAGNOSIS — O0990 Supervision of high risk pregnancy, unspecified, unspecified trimester: Secondary | ICD-10-CM

## 2016-05-19 NOTE — Progress Notes (Signed)
Anatomy scan today/N&V

## 2016-05-19 NOTE — Progress Notes (Signed)
Normal but incomplete anatomy scan, need RVOT and LVOT Fundal placenta. This is a girl baby! Feeling fetal movement ROB and FU scan in 4 weeks

## 2016-06-16 ENCOUNTER — Other Ambulatory Visit: Payer: Medicaid Other

## 2016-06-16 ENCOUNTER — Encounter: Payer: Medicaid Other | Admitting: Obstetrics & Gynecology

## 2016-06-17 ENCOUNTER — Encounter: Payer: Medicaid Other | Admitting: Obstetrics and Gynecology

## 2016-06-19 ENCOUNTER — Other Ambulatory Visit: Payer: Self-pay | Admitting: Advanced Practice Midwife

## 2016-06-23 ENCOUNTER — Encounter: Payer: Medicaid Other | Admitting: Obstetrics and Gynecology

## 2016-06-23 ENCOUNTER — Other Ambulatory Visit: Payer: Medicaid Other

## 2016-07-04 ENCOUNTER — Ambulatory Visit (INDEPENDENT_AMBULATORY_CARE_PROVIDER_SITE_OTHER): Payer: Medicaid Other | Admitting: Obstetrics and Gynecology

## 2016-07-04 VITALS — BP 122/75 | HR 109 | Wt 162.6 lb

## 2016-07-04 DIAGNOSIS — Z3492 Encounter for supervision of normal pregnancy, unspecified, second trimester: Secondary | ICD-10-CM

## 2016-07-04 NOTE — Progress Notes (Signed)
ROB- transfer from [facility], pt is doing well, wasn't prepared for glucola today, blood consent signed

## 2016-07-04 NOTE — Progress Notes (Signed)
Transfer OB-denies concerns, states normal pregnancy to date. Got nauseated while here and  Vomited once. Felt better afterwards. Will do glucola and f/u u/s to complete anatomy scan next week. Did not complete all prenatal panel labs- will add to next week labs.  Also desires VBAC again.

## 2016-07-07 ENCOUNTER — Ambulatory Visit: Payer: Medicaid Other

## 2016-07-07 ENCOUNTER — Ambulatory Visit (INDEPENDENT_AMBULATORY_CARE_PROVIDER_SITE_OTHER): Payer: Medicaid Other | Admitting: Certified Nurse Midwife

## 2016-07-07 ENCOUNTER — Encounter: Payer: Self-pay | Admitting: Certified Nurse Midwife

## 2016-07-07 VITALS — BP 121/83 | HR 101 | Wt 168.0 lb

## 2016-07-07 DIAGNOSIS — Z23 Encounter for immunization: Secondary | ICD-10-CM

## 2016-07-07 DIAGNOSIS — O0990 Supervision of high risk pregnancy, unspecified, unspecified trimester: Secondary | ICD-10-CM

## 2016-07-07 DIAGNOSIS — Z3482 Encounter for supervision of other normal pregnancy, second trimester: Secondary | ICD-10-CM | POA: Diagnosis not present

## 2016-07-07 DIAGNOSIS — Z131 Encounter for screening for diabetes mellitus: Secondary | ICD-10-CM

## 2016-07-07 DIAGNOSIS — Z3A20 20 weeks gestation of pregnancy: Secondary | ICD-10-CM

## 2016-07-07 LAB — POCT URINALYSIS DIPSTICK
BILIRUBIN UA: NEGATIVE
Glucose, UA: NEGATIVE
Ketones, UA: NEGATIVE
LEUKOCYTES UA: NEGATIVE
NITRITE UA: NEGATIVE
PH UA: 7 (ref 5.0–8.0)
PROTEIN UA: NEGATIVE
RBC UA: NEGATIVE
Spec Grav, UA: <=1.005 — AB (ref 1.010–1.025)
UROBILINOGEN UA: 0.2 U/dL

## 2016-07-07 MED ORDER — TETANUS-DIPHTH-ACELL PERTUSSIS 5-2.5-18.5 LF-MCG/0.5 IM SUSP
0.5000 mL | Freq: Once | INTRAMUSCULAR | Status: AC
  Administered 2016-07-07: 0.5 mL via INTRAMUSCULAR

## 2016-07-07 NOTE — Progress Notes (Signed)
ROB, doing well. Needs to consult with MD for TOLAC, reviewed with pt. GTT and remainder of  Prenatal labs completed today. Tdap today.  Anatomy U/S complete ( see below). Encouraged pt to sign up for birth classes and reviewed cord blood private and public banking. Follow up in 2 wks.   Doreene BurkeAnnie Wanona Stare, CNM  ULTRASOUND REPORT  Location: [facility] Date of Service: 07/07/16  Indications:F/U Anatomy Findings:  Jaclyn Austin intrauterine pregnancy is visualized with FHR at 143 BPM. Biometrics give an (U/S) Gestational age of 25 3/7 weeks and an (U/S) EDD of 10/03/16; this correlates with the clinically established EDD of 09/30/16.  Fetal presentation is Vertex.  EFW: 1025g (2lb 4oz) Williams 38%ile. Placenta: Right lateral, grade 0, remote to cervix, central cord insert. AFI: 13 cm.  Anatomic survey is complete with the acquisition of the RVOT and LVOT; Gender - female  .  Fetal stomach and bladder are visualized.  Impression: 1. 27 3/7 week Viable Singleton Intrauterine pregnancy by U/S. 2. (U/S) EDD is consistent with Clinically established (LMP) EDD of 09/30/16. 3. Completed anatomy scan  Recommendations: 1.Clinical correlation with the patient's History and Physical Exam.   Jaclyn MediciMaria E Austin

## 2016-07-07 NOTE — Addendum Note (Signed)
Addended by: Marchelle FolksMILLER, Raysean Graumann G on: 07/07/2016 03:30 PM   Modules accepted: Orders

## 2016-07-07 NOTE — Patient Instructions (Addendum)
Glucose Tolerance Test During Pregnancy The glucose tolerance test (GTT) is a blood test used to determine if you have developed a type of diabetes during pregnancy (gestational diabetes). This is when your body does not properly process sugar (glucose) in the food you eat, resulting in high blood glucose levels. Typically, a GTT is done after you have had a 1-hour glucose test with results that indicate you possibly have gestational diabetes. It may also be done if:  You have a history of giving birth to very large babies or have experienced repeated fetal loss (stillbirth).  You have signs and symptoms of diabetes, such as: ? Changes in your vision. ? Tingling or numbness in your hands or feet. ? Changes in hunger, thirst, and urination not otherwise explained by your pregnancy.  The GTT lasts about 3 hours. You will be given a sugar-water solution to drink at the beginning of the test. You will have blood drawn before you drink the solution and then again 1, 2, and 3 hours after you drink it. You will not be allowed to eat or drink anything else during the test. You must remain at the testing location to make sure that your blood is drawn on time. You should also avoid exercising during the test, because exercise can alter test results. How do I prepare for this test? Eat normally for 3 days prior to the GTT test, including having plenty of carbohydrate-rich foods. Do not eat or drink anything except water during the final 12 hours before the test. In addition, your health care provider may ask you to stop taking certain medicines before the test. What do the results mean? It is your responsibility to obtain your test results. Ask the lab or department performing the test when and how you will get your results. Contact your health care provider to discuss any questions you have about your results. Range of Normal Values Ranges for normal values may vary among different labs and hospitals. You  should always check with your health care provider after having lab work or other tests done to discuss whether your values are considered within normal limits. Normal levels of blood glucose are as follows:  Fasting: less than 105 mg/dL.  1 hour after drinking the solution: less than 190 mg/dL.  2 hours after drinking the solution: less than 165 mg/dL.  3 hours after drinking the solution: less than 145 mg/dL.  Some substances can interfere with GTT results. These may include:  Blood pressure and heart failure medicines, including beta blockers, furosemide, and thiazides.  Anti-inflammatory medicines, including aspirin.  Nicotine.  Some psychiatric medicines.  Meaning of Results Outside Normal Value Ranges GTT test results that are above normal values may indicate a number of health problems, such as:  Gestational diabetes.  Acute stress response.  Cushing syndrome.  Tumors such as pheochromocytoma or glucagonoma.  Long-term kidney problems.  Pancreatitis.  Hyperthyroidism.  Current infection.  Discuss your test results with your health care provider. He or she will use the results to make a diagnosis and determine a treatment plan that is right for you. This information is not intended to replace advice given to you by your health care provider. Make sure you discuss any questions you have with your health care provider. Document Released: 06/24/2011 Document Revised: 05/31/2015 Document Reviewed: 04/29/2013 Elsevier Interactive Patient Education  2017 [employer]. Tdap Vaccine (Tetanus, Diphtheria and Pertussis): What You Need to Know 1. Why get vaccinated? Tetanus, diphtheria and pertussis are very  serious diseases. Tdap vaccine can protect us from these diseases. And, Tdap vaccine given to pregnant women can protect newborn babies against pertussis. TETANUS (Lockjaw) is rare in the [place] today. It causes painful muscle tightening and stiffness, usually  all over the body.  It can lead to tightening of muscles in the head and neck so you can't open your mouth, swallow, or sometimes even breathe. Tetanus kills about 1 out of 10 people who are infected even after receiving the best medical care.  DIPHTHERIA is also rare in the [place] today. It can cause a thick coating to form in the back of the throat.  It can lead to breathing problems, heart failure, paralysis, and death.  PERTUSSIS (Whooping Cough) causes severe coughing spells, which can cause difficulty breathing, vomiting and disturbed sleep.  It can also lead to weight loss, incontinence, and rib fractures. Up to 2 in 100 adolescents and 5 in 100 adults with pertussis are hospitalized or have complications, which could include pneumonia or death.  These diseases are caused by bacteria. Diphtheria and pertussis are spread from person to person through secretions from coughing or sneezing. Tetanus enters the body through cuts, scratches, or wounds. Before vaccines, as many as 200,000 cases of diphtheria, 200,000 cases of pertussis, and hundreds of cases of tetanus, were reported in the [place] each year. Since vaccination began, reports of cases for tetanus and diphtheria have dropped by about 99% and for pertussis by about 80%. 2. Tdap vaccine Tdap vaccine can protect adolescents and adults from tetanus, diphtheria, and pertussis. One dose of Tdap is routinely given at age 25 or 5212. People who did not get Tdap at that age should get it as soon as possible. Tdap is especially important for healthcare professionals and anyone having close contact with a baby younger than 12 months. Pregnant women should get a dose of Tdap during every pregnancy, to protect the newborn from pertussis. Infants are most at risk for severe, life-threatening complications from pertussis. Another vaccine, called Td, protects against tetanus and diphtheria, but not pertussis. A Td booster should be given  every 10 years. Tdap may be given as one of these boosters if you have never gotten Tdap before. Tdap may also be given after a severe cut or burn to prevent tetanus infection. Your doctor or the person giving you the vaccine can give you more information. Tdap may safely be given at the same time as other vaccines. 3. Some people should not get this vaccine  A person who has ever had a life-threatening allergic reaction after a previous dose of any diphtheria, tetanus or pertussis containing vaccine, OR has a severe allergy to any part of this vaccine, should not get Tdap vaccine. Tell the person giving the vaccine about any severe allergies.  Anyone who had coma or long repeated seizures within 7 days after a childhood dose of DTP or DTaP, or a previous dose of Tdap, should not get Tdap, unless a cause other than the vaccine was found. They can still get Td.  Talk to your doctor if you: ? have seizures or another nervous system problem, ? had severe pain or swelling after any vaccine containing diphtheria, tetanus or pertussis, ? ever had a condition called Guillain-Barr Syndrome (GBS), ? aren't feeling well on the day the shot is scheduled. 4. Risks With any medicine, including vaccines, there is a chance of side effects. These are usually mild and go away on their own. Serious reactions  are also possible but are rare. Most people who get Tdap vaccine do not have any problems with it. Mild problems following Tdap: (Did not interfere with activities)  Pain where the shot was given (about 3 in 4 adolescents or 2 in 3 adults)  Redness or swelling where the shot was given (about 1 person in 5)  Mild fever of at least 100.39F (up to about 1 in 25 adolescents or 1 in 100 adults)  Headache (about 3 or 4 people in 10)  Tiredness (about 1 person in 3 or 4)  Nausea, vomiting, diarrhea, stomach ache (up to 1 in 4 adolescents or 1 in 10 adults)  Chills, sore joints (about 1 person in  10)  Body aches (about 1 person in 3 or 4)  Rash, swollen glands (uncommon)  Moderate problems following Tdap: (Interfered with activities, but did not require medical attention)  Pain where the shot was given (up to 1 in 5 or 6)  Redness or swelling where the shot was given (up to about 1 in 16 adolescents or 1 in 12 adults)  Fever over 102F (about 1 in 100 adolescents or 1 in 250 adults)  Headache (about 1 in 7 adolescents or 1 in 10 adults)  Nausea, vomiting, diarrhea, stomach ache (up to 1 or 3 people in 100)  Swelling of the entire arm where the shot was given (up to about 1 in 500).  Severe problems following Tdap: (Unable to perform usual activities; required medical attention)  Swelling, severe pain, bleeding and redness in the arm where the shot was given (rare).  Problems that could happen after any vaccine:  People sometimes faint after a medical procedure, including vaccination. Sitting or lying down for about 15 minutes can help prevent fainting, and injuries caused by a fall. Tell your doctor if you feel dizzy, or have vision changes or ringing in the ears.  Some people get severe pain in the shoulder and have difficulty moving the arm where a shot was given. This happens very rarely.  Any medication can cause a severe allergic reaction. Such reactions from a vaccine are very rare, estimated at fewer than 1 in a million doses, and would happen within a few minutes to a few hours after the vaccination. As with any medicine, there is a very remote chance of a vaccine causing a serious injury or death. The safety of vaccines is always being monitored. For more information, visit: http://floyd.org/www.cdc.gov/vaccinesafety/ 5. What if there is a serious problem? What should I look for? Look for anything that concerns you, such as signs of a severe allergic reaction, very high fever, or unusual behavior. Signs of a severe allergic reaction can include hives, swelling of the face and  throat, difficulty breathing, a fast heartbeat, dizziness, and weakness. These would usually start a few minutes to a few hours after the vaccination. What should I do?  If you think it is a severe allergic reaction or other emergency that can't wait, call 9-1-1 or get the person to the nearest hospital. Otherwise, call your doctor.  Afterward, the reaction should be reported to the Vaccine Adverse Event Reporting System (VAERS). Your doctor might file this report, or you can do it yourself through the VAERS web site at www.vaers.LAgents.nohhs.gov, or by calling 1-938 052 7878. ? VAERS does not give medical advice. 6. The National Vaccine Injury Compensation Program The National Vaccine Injury Compensation Program (VICP) is a federal program that was created to compensate people who may have been injured  by certain vaccines. Persons who believe they may have been injured by a vaccine can learn about the program and about filing a claim by calling 1-(667)387-1618 or visiting the VICP website at SpiritualWord.at. There is a time limit to file a claim for compensation. 7. How can I learn more?  Ask your doctor. He or she can give you the vaccine package insert or suggest other sources of information.  Call your local or state health department.  Contact the Centers for Disease Control and Prevention (CDC): ? Call (701) 706-4044 (1-800-CDC-INFO) or ? Visit CDC's website at PicCapture.uy CDC Tdap Vaccine VIS (03/01/13) This information is not intended to replace advice given to you by your health care provider. Make sure you discuss any questions you have with your health care provider. Document Released: 06/24/2011 Document Revised: 09/13/2015 Document Reviewed: 09/13/2015 Elsevier Interactive Patient Education  [address]   [place] Class  June 18, 2016  Wednesday 7:00p - 9:00p  [facility] [place], [place]  July 23, 2016  Wednesday 7:00p -  9:00p [facility] [place], [place]    August 27, 2016   Wednesday 7:00p - 9:000p [facility] [place], [place]  September 24, 2016  Wednesday  7:00p - 9:00p [facility] [place], [place]  October 22, 2016 Wednesday 7:00p - 9:00p [facility] [place], [place]  Interested in a waterbirth?  This informational class will help you discover whether waterbirth is the right fit for you.  Education about waterbirth itself, supplies you would need and how to assemble your support team is what you can expect from this class.  Some obstetrical practices require this class in order to pursue a waterbirth.  (Not all obstetrical practices offer waterbirth check with your healthcare provider)  Register only the expectant mom, but you are encouraged to bring your partner to class!  Fees & Payment No fee  Register Online [email or web address]  Search [place]

## 2016-07-07 NOTE — Addendum Note (Signed)
Addended by: Marchelle FolksMILLER, Della Scrivener G on: 07/07/2016 03:32 PM   Modules accepted: Orders

## 2016-07-07 NOTE — Progress Notes (Signed)
ROB- gtt and tdap today.

## 2016-07-08 ENCOUNTER — Encounter: Payer: Self-pay | Admitting: Certified Nurse Midwife

## 2016-07-08 LAB — VARICELLA ZOSTER ANTIBODY, IGG: Varicella zoster IgG: 1085 index (ref >165)

## 2016-07-08 LAB — SICKLE CELL SCREEN: Sickle Cell Screen: NEGATIVE

## 2016-07-08 LAB — RPR: RPR Ser Ql: NONREACTIVE

## 2016-07-08 LAB — ANTIBODY SCREEN: ANTIBODY SCREEN: NEGATIVE

## 2016-07-08 LAB — RUBELLA SCREEN: RUBELLA: 3.44 {index} (ref >0.99)

## 2016-07-08 LAB — HIV ANTIBODY (ROUTINE TESTING W REFLEX): HIV Screen 4th Generation wRfx: NONREACTIVE

## 2016-07-08 LAB — GLUCOSE, 1 HOUR GESTATIONAL: GESTATIONAL DIABETES SCREEN: 69 mg/dL (ref 65–139)

## 2016-07-08 LAB — HEPATITIS B SURFACE ANTIGEN: HEP B S AG: NEGATIVE

## 2016-07-09 LAB — URINE CULTURE, OB REFLEX

## 2016-07-09 LAB — CULTURE, OB URINE

## 2016-07-11 LAB — NICOTINE SCREEN, URINE: Cotinine Ql Scrn, Ur: NEGATIVE ng/mL

## 2016-07-11 LAB — DRUG PROFILE, UR, 9 DRUGS (LABCORP)
AMPHETAMINES, URINE: NEGATIVE ng/mL
Barbiturate Quant, Ur: NEGATIVE ng/mL
Benzodiazepine Quant, Ur: NEGATIVE ng/mL
CANNABINOID QUANT UR: NEGATIVE ng/mL
COCAINE (METAB.): NEGATIVE ng/mL
METHADONE SCREEN, URINE: NEGATIVE ng/mL
Opiate Quant, Ur: NEGATIVE ng/mL
PCP Quant, Ur: NEGATIVE ng/mL
Propoxyphene: NEGATIVE ng/mL

## 2016-07-11 LAB — NICOTINE/COTININE METABOLITES
Cotinine: NOT DETECTED ng/mL
NICOTINE: NOT DETECTED ng/mL

## 2016-07-17 NOTE — Progress Notes (Signed)
I guess this patient transferred to [facility]

## 2016-07-19 ENCOUNTER — Encounter: Payer: Self-pay | Admitting: Obstetrics and Gynecology

## 2016-07-22 ENCOUNTER — Ambulatory Visit (INDEPENDENT_AMBULATORY_CARE_PROVIDER_SITE_OTHER): Payer: Medicaid Other | Admitting: Obstetrics and Gynecology

## 2016-07-22 VITALS — BP 116/75 | HR 105 | Wt 167.4 lb

## 2016-07-22 DIAGNOSIS — Z3483 Encounter for supervision of other normal pregnancy, third trimester: Secondary | ICD-10-CM | POA: Diagnosis not present

## 2016-07-22 DIAGNOSIS — O34219 Maternal care for unspecified type scar from previous cesarean delivery: Secondary | ICD-10-CM

## 2016-07-22 DIAGNOSIS — N898 Other specified noninflammatory disorders of vagina: Secondary | ICD-10-CM

## 2016-07-22 LAB — POCT URINALYSIS DIPSTICK
Bilirubin, UA: NEGATIVE
Glucose, UA: NEGATIVE
Ketones, UA: NEGATIVE
Leukocytes, UA: NEGATIVE
Nitrite, UA: NEGATIVE
PH UA: 6.5 (ref 5.0–8.0)
Protein, UA: NEGATIVE
RBC UA: NEGATIVE
Spec Grav, UA: 1.02 (ref 1.010–1.025)
UROBILINOGEN UA: 0.2 U/dL

## 2016-07-22 NOTE — Progress Notes (Addendum)
ROB Consult: Notes vaginal irritation x 1 week. Denies vaginal discharge, odor, or abnormal bleeding. Negative wet prep. Advised on Vagisil prn.  Is present to discuss TOLAC.  Patient has h/o C-section x 1, followed by successful VBAC.  Discussed risks vs benefits, as well as repeat C-section.  No significant risk factors present that would prevent repeat successful VBAC. Patient inquires into water birth.  Discussed that [facility] does not offer water births, but does offer hydrotherapy (however we at this time do not have waterproof monitors to allow for continuous monitoring).  Can discuss other options further with midwives at next visit. Normal glucola last visit. RTC in 2 weeks.

## 2016-07-23 ENCOUNTER — Other Ambulatory Visit: Payer: Self-pay | Admitting: Certified Nurse Midwife

## 2016-07-23 ENCOUNTER — Encounter: Payer: Self-pay | Admitting: Certified Nurse Midwife

## 2016-07-23 MED ORDER — RANITIDINE HCL 75 MG PO TABS: 75.0000 mg | ORAL_TABLET | Freq: Every day | ORAL | 3 refills | Status: DC

## 2016-07-23 NOTE — Progress Notes (Signed)
Pt suffering with heartburn, requesting something stronger. Prescription sent to pharmacy for zantac.  Jaclyn BurkeAnnie Jazz Austin, CNM

## 2016-07-24 LAB — URINE CULTURE

## 2016-07-31 ENCOUNTER — Encounter: Payer: Self-pay | Admitting: Certified Nurse Midwife

## 2016-08-03 ENCOUNTER — Emergency Department
Admission: EM | Admit: 2016-08-03 | Discharge: 2016-08-03 | Disposition: A | Discharge status: Home / Self Care | Payer: Medicaid Other | Attending: Emergency Medicine | Admitting: Emergency Medicine

## 2016-08-03 ENCOUNTER — Encounter: Payer: Self-pay | Admitting: Emergency Medicine

## 2016-08-03 DIAGNOSIS — R1110 Vomiting, unspecified: Secondary | ICD-10-CM

## 2016-08-03 DIAGNOSIS — O219 Vomiting of pregnancy, unspecified: Secondary | ICD-10-CM | POA: Diagnosis not present

## 2016-08-03 DIAGNOSIS — R112 Nausea with vomiting, unspecified: Secondary | ICD-10-CM | POA: Diagnosis present

## 2016-08-03 DIAGNOSIS — Z3493 Encounter for supervision of normal pregnancy, unspecified, third trimester: Secondary | ICD-10-CM

## 2016-08-03 DIAGNOSIS — O26893 Other specified pregnancy related conditions, third trimester: Secondary | ICD-10-CM | POA: Diagnosis not present

## 2016-08-03 DIAGNOSIS — Z3A32 32 weeks gestation of pregnancy: Secondary | ICD-10-CM | POA: Diagnosis not present

## 2016-08-03 DIAGNOSIS — R42 Dizziness and giddiness: Secondary | ICD-10-CM

## 2016-08-03 LAB — COMPREHENSIVE METABOLIC PANEL
ALK PHOS: 100 U/L (ref 38–126)
ALT: 13 U/L — AB (ref 14–54)
AST: 23 U/L (ref 15–41)
Albumin: 3.6 g/dL (ref 3.5–5.0)
Anion gap: 9 (ref 5–15)
BILIRUBIN TOTAL: 0.5 mg/dL (ref 0.3–1.2)
BUN: 10 mg/dL (ref 6–20)
CALCIUM: 8.9 mg/dL (ref 8.9–10.3)
CO2: 23 mmol/L (ref 22–32)
CREATININE: 0.75 mg/dL (ref 0.44–1.00)
Chloride: 102 mmol/L (ref 101–111)
GFR calc Af Amer: >60 mL/min (ref >60)
Glucose, Bld: 96 mg/dL (ref 65–99)
Potassium: 3.8 mmol/L (ref 3.5–5.1)
Sodium: 134 mmol/L — ABNORMAL LOW (ref 135–145)
TOTAL PROTEIN: 8.1 g/dL (ref 6.5–8.1)

## 2016-08-03 LAB — LIPASE, BLOOD: Lipase: 24 U/L (ref 11–51)

## 2016-08-03 LAB — CBC
HCT: 34.9 % — ABNORMAL LOW (ref 35.0–47.0)
Hemoglobin: 11.6 g/dL — ABNORMAL LOW (ref 12.0–16.0)
MCH: 29.1 pg (ref 26.0–34.0)
MCHC: 33.1 g/dL (ref 32.0–36.0)
MCV: 87.9 fL (ref 80.0–100.0)
PLATELETS: 331 10*3/uL (ref 150–440)
RBC: 3.98 MIL/uL (ref 3.80–5.20)
RDW: 14 % (ref 11.5–14.5)
WBC: 10.3 10*3/uL (ref 3.6–11.0)

## 2016-08-03 MED ORDER — METOCLOPRAMIDE HCL 5 MG/ML IJ SOLN
10.0000 mg | Freq: Once | INTRAMUSCULAR | Status: AC
  Administered 2016-08-03: 10 mg via INTRAVENOUS

## 2016-08-03 MED ORDER — ONDANSETRON 4 MG PO TBDP: 4.0000 mg | ORAL_TABLET | Freq: Three times a day (TID) | ORAL | 0 refills | Status: DC | PRN

## 2016-08-03 MED ORDER — METOCLOPRAMIDE HCL 5 MG/ML IJ SOLN
INTRAMUSCULAR | Status: AC
  Filled 2016-08-03: qty 2

## 2016-08-03 MED ORDER — ONDANSETRON HCL 4 MG/2ML IJ SOLN
4.0000 mg | Freq: Once | INTRAMUSCULAR | Status: AC
  Administered 2016-08-03: 4 mg via INTRAVENOUS
  Filled 2016-08-03: qty 2

## 2016-08-03 MED ORDER — SODIUM CHLORIDE 0.9 % IV BOLUS (SEPSIS)
1000.0000 mL | Freq: Once | INTRAVENOUS | Status: AC
  Administered 2016-08-03: 1000 mL via INTRAVENOUS

## 2016-08-03 MED ORDER — SODIUM CHLORIDE 0.9 % IV SOLN
Freq: Once | INTRAVENOUS | Status: AC
  Administered 2016-08-03: 21:00:00 via INTRAVENOUS

## 2016-08-03 NOTE — ED Provider Notes (Signed)
[facility] Emergency Department Provider Note       Time seen: ----------------------------------------- 8:20 PM on 08/03/2016 -----------------------------------------     I have reviewed the triage vital signs and the nursing notes.   HISTORY   Chief Complaint Emesis and Dizziness    HPI Jaclyn Austin is a 26 y.o. female who presents to the ED for nausea and vomiting started today. Patient reports she is around [redacted] weeks pregnant and has been having contractions that are far apart. Patient was concerned about nausea and vomiting reports she's been dizzy. She stills feels like the baby is moving and active. Patient was discussed with labor and delivery prior to my evaluation states that if she is still having contractions they will evaluate her in the hospital. She denies other complaints at this time.   Past Medical History:  Diagnosis Date  . Anemia   . PONV (postoperative nausea and vomiting)     Patient Active Problem List   Diagnosis Date Noted  . Supervision of high risk pregnancy, antepartum 04/02/2016  . History of pregnancy induced hypertension 04/02/2016  . History of cesarean section 04/02/2016  . First trimester bleeding 03/12/2016    Past Surgical History:  Procedure Laterality Date  . CESAREAN SECTION      Allergies Penicillins  Social History Social History  Substance Use Topics  . Smoking status: Never Smoker  . Smokeless tobacco: Never Used  . Alcohol use No    Review of Systems Constitutional: Negative for fever. Eyes: Negative for vision changes ENT:  Positive for recent congestion Cardiovascular: Negative for chest pain. Respiratory: Negative for shortness of breath. Gastrointestinal: Negative for abdominal pain, Positive for nausea & vomiting, recent diarrhea Genitourinary: Negative for dysuria. Musculoskeletal: Negative for back pain. Skin: Negative for rash. Neurological: Negative for headaches, focal  weakness or numbness.  All systems negative/normal/unremarkable except as stated in the HPI  ____________________________________________   PHYSICAL EXAM:  VITAL SIGNS: ED Triage Vitals [08/03/16 1836]  Enc Vitals Group     BP 134/84     Pulse Rate (!) 110     Resp 20     Temp 97.8 F (36.6 C)     Temp Source Oral     SpO2 98 %     Weight 170 lb (77.1 kg)     Height      Head Circumference      Peak Flow      Pain Score      Pain Loc      Pain Edu?      Excl. in GC?     Constitutional: Alert and oriented. Well appearing and in no distress. Eyes: Conjunctivae are normal. Normal extraocular movements. ENT   Head: Normocephalic and atraumatic.   Nose: No congestion/rhinnorhea.   Mouth/Throat: Mucous membranes are moist.   Neck: No stridor. Cardiovascular: Normal rate, regular rhythm. No murmurs, rubs, or gallops. Respiratory: Normal respiratory effort without tachypnea nor retractions. Breath sounds are clear and equal bilaterally. No wheezes/rales/rhonchi. Gastrointestinal: Soft and nontender, gravid uterus, nontender. Musculoskeletal: Nontender with normal range of motion in extremities. No lower extremity tenderness nor edema. Neurologic:  Normal speech and language. No gross focal neurologic deficits are appreciated.  Skin:  Skin is warm, dry and intact. No rash noted. Psychiatric: Mood and affect are normal. Speech and behavior are normal.  ____________________________________________  ED COURSE:  Pertinent labs & imaging results that were available during my care of the patient were reviewed by me and considered in  my medical decision making (see chart for details). Patient presents for dizziness as well as nausea and vomiting, we will assess with labs as indicated. Prior to my evaluation she received a liter of saline as well as antiemetics and she states her symptoms have resolved.   Procedures ____________________________________________   LABS  (pertinent positives/negatives)  Labs Reviewed  COMPREHENSIVE METABOLIC PANEL - Abnormal; Notable for the following:       Result Value   Sodium 134 (*)    ALT 13 (*)    All other components within normal limits  CBC - Abnormal; Notable for the following:    Hemoglobin 11.6 (*)    HCT 34.9 (*)    All other components within normal limits  LIPASE, BLOOD  URINALYSIS, COMPLETE (UACMP) WITH MICROSCOPIC    RADIOLOGY Bedside ultrasound  Bedside pregnancy ultrasound reveals intrauterine gestation with normal activity level, fetal heart rate 160 beats a minute.  ____________________________________________  FINAL ASSESSMENT AND PLAN  Nausea and vomiting, third trimester pregnancy  Plan: Patient's labs and imaging were dictated above. Patient had presented for nausea and vomiting in the third trimester pregnancy. This is likely viral. Prior to my evaluation her symptoms had resolved, But just as a precaution she was given more fluids. She'll be discharged with Zofran. She is stable for outpatient follow-up.   Emily FilbertWilliams, Cozette Braggs E, MD   Note: This note was generated in part or whole with voice recognition software. Voice recognition is usually quite accurate but there are transcription errors that can and very often do occur. I apologize for any typographical errors that were not detected and corrected.     Emily FilbertWilliams, Cleta Heatley E, MD 08/03/16 2043

## 2016-08-03 NOTE — ED Triage Notes (Signed)
Pt here for nausea/vomiting that started today.  Having contractions that are far apart. Pt only concerned about nausea and vomiting and reports that she has been dizzy.  Still feels baby moving.  Spoke with L&D and they said if she is still contracting after treatment/eval they will see her.  Dizziness worse when change positions.  Describes as room spinning.

## 2016-08-05 ENCOUNTER — Ambulatory Visit (INDEPENDENT_AMBULATORY_CARE_PROVIDER_SITE_OTHER): Payer: Medicaid Other | Admitting: Certified Nurse Midwife

## 2016-08-05 ENCOUNTER — Encounter: Payer: Self-pay | Admitting: Certified Nurse Midwife

## 2016-08-05 VITALS — BP 124/78 | HR 101 | Wt 169.1 lb

## 2016-08-05 DIAGNOSIS — Z3483 Encounter for supervision of other normal pregnancy, third trimester: Secondary | ICD-10-CM

## 2016-08-05 LAB — POCT URINALYSIS DIPSTICK
BILIRUBIN UA: NEGATIVE
Blood, UA: NEGATIVE
GLUCOSE UA: NEGATIVE
KETONES UA: NEGATIVE
LEUKOCYTES UA: NEGATIVE
NITRITE UA: NEGATIVE
PH UA: 6 (ref 5.0–8.0)
Protein, UA: NEGATIVE
Spec Grav, UA: 1.01 (ref 1.010–1.025)
Urobilinogen, UA: 0.2 E.U./dL

## 2016-08-05 NOTE — Patient Instructions (Signed)
Trial of Labor After Cesarean Delivery A trial of labor after cesarean delivery (TOLAC) is when a woman tries to give birth vaginally after a previous cesarean delivery. TOLAC may be a safe and appropriate option for you depending on your medical history and other risk factors. When TOLAC is successful and you are able to have a vaginal delivery, this is called a vaginal birth after cesarean delivery (VBAC). Candidates for TOLAC TOLAC is possible for some women who:  Have undergone one or two prior cesarean deliveries in which the incision of the uterus was horizontal (low transverse).  Are carrying twins and have had one prior low transverse incision during a cesarean delivery.  Do not have a vertical (classical) uterine scar.  Have not had a tear in the wall of their uterus (uterine rupture).  TOLAC is also supported for women who meet appropriate criteria and:  Are under the age of 40 years.  Are tall and have a body mass index (BMI) of less than 30.  Have an unknown uterine scar.  Give birth in a facility equipped to handle an emergency cesarean delivery. This team should be able to handle possible complications such as a uterine rupture.  Have thorough counseling about the benefits and risks of TOLAC.  Have discussed future pregnancy plans with their health care provider.  Plan to have several more pregnancies.  Most successful candidates for TOLAC:  Have had a successful vaginal delivery before or after their cesarean delivery.  Experience labor that begins naturally on or before the due date (40 weeks of gestation).  Do not have a very large (macrosomic) baby.  Had a prior cesarean delivery but are not currently experiencing factors that would prompt a cesarean delivery (such as a breech position).  Had only one prior cesarean delivery.  Had a prior cesarean delivery that was performed early in labor and not after full cervical dilation. TOLAC may be most appropriate  for women who meet the above guidelines and who plan to have more pregnancies. TOLAC is not recommended for home births. Least successful candidates for TOLAC:  Have an induced labor with an unfavorable cervix. An unfavorable cervix is when the cervix is not dilating enough (among other factors).  Have never had a vaginal delivery.  Have had more than two cesarean deliveries.  Have a pregnancy at more than 40 weeks of gestation.  Are pregnant with a baby with a suspected weight greater than 4,000 grams (8 pounds) and who have no prior history of a vaginal delivery.  Have closely spaced pregnancies. Suggested benefits of TOLAC  You may have a faster recovery time.  You may have a shorter stay in the hospital.  You may have less pain and fewer problems than with a cesarean delivery. Women who have a cesarean delivery have a higher chance of needing blood or getting a fever, an infection, or a blood clot in the legs. Suggested risks of TOLAC The highest risk of complications happens to women who attempt a TOLAC and fail. A failed TOLAC results in an unplanned cesarean delivery. Risks related to TOLAC or repeat cesarean deliveries include:  Blood loss.  Infection.  Blood clot.  Injury to surrounding tissues or organs.  Having to remove the uterus (hysterectomy).  Potential problems with the placenta (such as placenta previa or placenta accreta) in future pregnancies.  Although very rare, the main concerns with TOLAC are:  Rupture of the uterine scar from a past cesarean delivery.  Needing an emergency   cesarean delivery.  Having a bad outcome for the baby (perinatal morbidity).  Where to find more information:  American Congress of Obstetricians and Gynecologists: www.acog.org  American College of Nurse-Midwives: www.midwife.org This information is not intended to replace advice given to you by your health care provider. Make sure you discuss any questions you have with  your health care provider. Document Released: 09/10/2010 Document Revised: 11/21/2015 Document Reviewed: 06/14/2012 Elsevier Interactive Patient Education  2018 Elsevier Inc.  

## 2016-08-05 NOTE — Progress Notes (Signed)
ROB-Pt doing well, reports nausea and intermittent dizziness. Denies difficulty breathing or respiratory distress, chest pain, and visual changes. Seen in ED on 08/03/2016 for similar symptoms. Discussed round ligament pain and home treatment measures including abdominal support, handout given. Advised oral rehydration drinks, rest, and changing positions slowly. Desires Nexplanon for pregnancy prevention and discussed immediate postpartum placement if desired. Reviewed flag symptoms and when to call. RTC x 2 week for ROB or sooner if needed. Pt met with Leda Gauze, [facility] Pregnancy Case Manager, immediately prior to today's visit.

## 2016-08-19 ENCOUNTER — Encounter: Payer: Medicaid Other | Admitting: Obstetrics and Gynecology

## 2016-08-22 ENCOUNTER — Ambulatory Visit (INDEPENDENT_AMBULATORY_CARE_PROVIDER_SITE_OTHER): Payer: Medicaid Other | Admitting: Obstetrics and Gynecology

## 2016-08-22 VITALS — BP 127/84 | HR 88 | Wt 171.1 lb

## 2016-08-22 DIAGNOSIS — Z3493 Encounter for supervision of normal pregnancy, unspecified, third trimester: Secondary | ICD-10-CM

## 2016-08-22 LAB — POCT URINALYSIS DIPSTICK
Bilirubin, UA: NEGATIVE
Blood, UA: NEGATIVE
GLUCOSE UA: NEGATIVE
KETONES UA: NEGATIVE
Leukocytes, UA: NEGATIVE
Nitrite, UA: NEGATIVE
Protein, UA: NEGATIVE
SPEC GRAV UA: 1.01 (ref 1.010–1.025)
UROBILINOGEN UA: 0.2 U/dL
pH, UA: 6.5 (ref 5.0–8.0)

## 2016-08-22 MED ORDER — KETOCONAZOLE 2 % EX CREA: 1.0000 "application " | TOPICAL_CREAM | Freq: Every day | CUTANEOUS | 0 refills | Status: DC

## 2016-08-22 NOTE — Progress Notes (Signed)
ROB- pt is c/o rash on her neck states it itches badly, x 1 week

## 2016-08-22 NOTE — Progress Notes (Signed)
ROB-fungal rash on next cream prescribed, cultures next.

## 2016-09-05 ENCOUNTER — Ambulatory Visit (INDEPENDENT_AMBULATORY_CARE_PROVIDER_SITE_OTHER): Payer: Medicaid Other | Admitting: Certified Nurse Midwife

## 2016-09-05 ENCOUNTER — Encounter: Payer: Self-pay | Admitting: Certified Nurse Midwife

## 2016-09-05 VITALS — BP 123/71 | HR 107 | Wt 176.1 lb

## 2016-09-05 DIAGNOSIS — Z113 Encounter for screening for infections with a predominantly sexual mode of transmission: Secondary | ICD-10-CM

## 2016-09-05 DIAGNOSIS — Z3685 Encounter for antenatal screening for Streptococcus B: Secondary | ICD-10-CM

## 2016-09-05 DIAGNOSIS — Z3493 Encounter for supervision of normal pregnancy, unspecified, third trimester: Secondary | ICD-10-CM

## 2016-09-05 LAB — POCT URINALYSIS DIPSTICK
BILIRUBIN UA: NEGATIVE
Blood, UA: NEGATIVE
Glucose, UA: NEGATIVE
KETONES UA: NEGATIVE
LEUKOCYTES UA: NEGATIVE
Nitrite, UA: NEGATIVE
PROTEIN UA: NEGATIVE
SPEC GRAV UA: 1.01 (ref 1.010–1.025)
Urobilinogen, UA: 0.2 E.U./dL
pH, UA: 7.5 (ref 5.0–8.0)

## 2016-09-05 NOTE — Patient Instructions (Signed)

## 2016-09-05 NOTE — Progress Notes (Signed)
Jaclyn Austin, doing well. State that she is having irregular contractions. Labor precautions reviewed. GBS today. Will follow up with result. Jaclyn Austin 1 wk.   Doreene BurkeAnnie Nasiir Monts, CNM

## 2016-09-05 NOTE — Progress Notes (Signed)
ROB- 36 week cultures. Ant bite on left big toe.

## 2016-09-07 ENCOUNTER — Encounter: Payer: Self-pay | Admitting: Certified Nurse Midwife

## 2016-09-07 LAB — STREP GP B NAA+RFLX: Strep Gp B NAA+Rflx: NEGATIVE

## 2016-09-09 LAB — GC/CHLAMYDIA PROBE AMP
Chlamydia trachomatis, NAA: NEGATIVE
NEISSERIA GONORRHOEAE BY PCR: NEGATIVE

## 2016-09-12 ENCOUNTER — Encounter: Payer: Self-pay | Admitting: Obstetrics and Gynecology

## 2016-09-13 ENCOUNTER — Observation Stay
Admission: EM | Admit: 2016-09-13 | Discharge: 2016-09-13 | Disposition: A | Discharge status: Home / Self Care | Payer: Medicaid Other | Attending: Obstetrics and Gynecology | Admitting: Obstetrics and Gynecology

## 2016-09-13 DIAGNOSIS — O471 False labor at or after 37 completed weeks of gestation: Secondary | ICD-10-CM | POA: Diagnosis present

## 2016-09-13 DIAGNOSIS — Z3A37 37 weeks gestation of pregnancy: Secondary | ICD-10-CM | POA: Insufficient documentation

## 2016-09-14 NOTE — Discharge Summary (Signed)
L&D OB Triage Note  Jaclyn PettiesBobbie Austin is a 26 y.o. [id number] female at 4470w5d, EDD Estimated Date of Delivery: 09/30/16 who presented to triage for complaints of irregular contractions.  She was evaluated by the nurses with no significant findings/findings significant for labor. Vital signs stable. An NST was performed and has been reviewed by myself. She was treated with po hydration.   NST INTERPRETATION: Indications: rule out uterine contractions  Mode: External Baseline Rate (A): 125 bpm Variability: Moderate Accelerations: 15 x 15 Decelerations: None     Contraction Frequency (min): iritibility   Impression: reactive   Plan: NST performed was reviewed and was found to be reactive. She was discharged home with bleeding/labor precautions.  Continue routine prenatal care. Follow up with OB/GYN as previously scheduled.     Glenard Keesling Suzan NailerN Danetta Prom, CNM

## 2016-09-16 ENCOUNTER — Ambulatory Visit (INDEPENDENT_AMBULATORY_CARE_PROVIDER_SITE_OTHER): Payer: Medicaid Other | Admitting: Obstetrics and Gynecology

## 2016-09-16 VITALS — BP 120/73 | HR 93 | Wt 175.9 lb

## 2016-09-16 DIAGNOSIS — Z3493 Encounter for supervision of normal pregnancy, unspecified, third trimester: Secondary | ICD-10-CM

## 2016-09-16 LAB — POCT URINALYSIS DIPSTICK
BILIRUBIN UA: NEGATIVE
Blood, UA: NEGATIVE
GLUCOSE UA: NEGATIVE
KETONES UA: NEGATIVE
LEUKOCYTES UA: NEGATIVE
NITRITE UA: NEGATIVE
PH UA: 6 (ref 5.0–8.0)
Protein, UA: NEGATIVE
Spec Grav, UA: 1.01 (ref 1.010–1.025)
Urobilinogen, UA: 0.2 E.U./dL

## 2016-09-16 NOTE — Progress Notes (Signed)
ROB-reports increased contractions.

## 2016-09-16 NOTE — Progress Notes (Signed)
ROB- pt is having a lot of pelvic pressure, contractions

## 2016-09-20 ENCOUNTER — Observation Stay: Payer: Medicaid Other

## 2016-09-20 ENCOUNTER — Inpatient Hospital Stay: Payer: Medicaid Other | Admitting: Anesthesiology

## 2016-09-20 ENCOUNTER — Inpatient Hospital Stay
Admission: EM | Admit: 2016-09-20 | Discharge: 2016-09-22 | DRG: 775 | Disposition: A | Discharge status: Home / Self Care | Payer: Medicaid Other | Attending: Certified Nurse Midwife | Admitting: Certified Nurse Midwife

## 2016-09-20 DIAGNOSIS — O34211 Maternal care for low transverse scar from previous cesarean delivery: Secondary | ICD-10-CM | POA: Diagnosis present

## 2016-09-20 DIAGNOSIS — O26893 Other specified pregnancy related conditions, third trimester: Secondary | ICD-10-CM | POA: Diagnosis present

## 2016-09-20 DIAGNOSIS — Z3A38 38 weeks gestation of pregnancy: Secondary | ICD-10-CM

## 2016-09-20 DIAGNOSIS — O288 Other abnormal findings on antenatal screening of mother: Secondary | ICD-10-CM

## 2016-09-20 LAB — CBC
HCT: 33.4 % — ABNORMAL LOW (ref 35.0–47.0)
HEMOGLOBIN: 11.2 g/dL — AB (ref 12.0–16.0)
MCH: 29 pg (ref 26.0–34.0)
MCHC: 33.6 g/dL (ref 32.0–36.0)
MCV: 86.3 fL (ref 80.0–100.0)
PLATELETS: 310 10*3/uL (ref 150–440)
RBC: 3.87 MIL/uL (ref 3.80–5.20)
RDW: 14.8 % — AB (ref 11.5–14.5)
WBC: 9.6 10*3/uL (ref 3.6–11.0)

## 2016-09-20 LAB — TYPE AND SCREEN
ABO/RH(D): O POS
ANTIBODY SCREEN: NEGATIVE

## 2016-09-20 MED ORDER — OXYCODONE-ACETAMINOPHEN 5-325 MG PO TABS
1.0000 | ORAL_TABLET | ORAL | Status: DC | PRN
  Administered 2016-09-21 – 2016-09-22 (×5): 1 via ORAL
  Filled 2016-09-20 (×4): qty 1

## 2016-09-20 MED ORDER — WITCH HAZEL-GLYCERIN EX PADS: 1.0000 "application " | MEDICATED_PAD | CUTANEOUS | Status: DC | PRN

## 2016-09-20 MED ORDER — BUTORPHANOL TARTRATE 1 MG/ML IJ SOLN: 1.0000 mg | INTRAMUSCULAR | Status: DC | PRN

## 2016-09-20 MED ORDER — TERBUTALINE SULFATE 1 MG/ML IJ SOLN: 0.2500 mg | Freq: Once | INTRAMUSCULAR | Status: DC | PRN

## 2016-09-20 MED ORDER — LIDOCAINE HCL (PF) 1 % IJ SOLN
INTRAMUSCULAR | Status: DC | PRN
  Administered 2016-09-20: 2 mL via SUBCUTANEOUS

## 2016-09-20 MED ORDER — LACTATED RINGERS IV SOLN: 500.0000 mL | Freq: Once | INTRAVENOUS | Status: DC

## 2016-09-20 MED ORDER — EPHEDRINE 5 MG/ML INJ
10.0000 mg | INTRAVENOUS | Status: DC | PRN
  Filled 2016-09-20: qty 2

## 2016-09-20 MED ORDER — DIPHENHYDRAMINE HCL 25 MG PO CAPS: 25.0000 mg | ORAL_CAPSULE | Freq: Four times a day (QID) | ORAL | Status: DC | PRN

## 2016-09-20 MED ORDER — SIMETHICONE 80 MG PO CHEW: 80.0000 mg | CHEWABLE_TABLET | ORAL | Status: DC | PRN

## 2016-09-20 MED ORDER — SODIUM CHLORIDE 0.9 % IV SOLN
INTRAVENOUS | Status: DC | PRN
  Administered 2016-09-20 (×2): 5 mL via EPIDURAL

## 2016-09-20 MED ORDER — PRENATAL MULTIVITAMIN CH
1.0000 | ORAL_TABLET | Freq: Every day | ORAL | Status: DC
  Administered 2016-09-21 – 2016-09-22 (×2): 1 via ORAL
  Filled 2016-09-20 (×2): qty 1

## 2016-09-20 MED ORDER — FENTANYL 2.5 MCG/ML W/ROPIVACAINE 0.15% IN NS 100 ML EPIDURAL (ARMC)
EPIDURAL | Status: DC | PRN
  Administered 2016-09-20: 12 mL/h via EPIDURAL

## 2016-09-20 MED ORDER — FENTANYL 2.5 MCG/ML W/ROPIVACAINE 0.15% IN NS 100 ML EPIDURAL (ARMC): 12.0000 mL/h | EPIDURAL | Status: DC

## 2016-09-20 MED ORDER — OXYCODONE-ACETAMINOPHEN 5-325 MG PO TABS
2.0000 | ORAL_TABLET | ORAL | Status: DC | PRN
Start: 2016-09-20 — End: 2016-09-20

## 2016-09-20 MED ORDER — LIDOCAINE HCL (PF) 1 % IJ SOLN: 30.0000 mL | INTRAMUSCULAR | Status: DC | PRN

## 2016-09-20 MED ORDER — HYDROCORTISONE 1 % EX CREA
TOPICAL_CREAM | Freq: Four times a day (QID) | CUTANEOUS | Status: DC
  Administered 2016-09-21 – 2016-09-22 (×5): via TOPICAL
  Filled 2016-09-20 (×2): qty 28

## 2016-09-20 MED ORDER — FAMOTIDINE 20 MG PO TABS
20.0000 mg | ORAL_TABLET | Freq: Every day | ORAL | Status: DC
  Administered 2016-09-21 – 2016-09-22 (×2): 20 mg via ORAL
  Filled 2016-09-20 (×2): qty 1

## 2016-09-20 MED ORDER — LACTATED RINGERS IV SOLN
INTRAVENOUS | Status: DC
  Administered 2016-09-20 (×2): via INTRAVENOUS

## 2016-09-20 MED ORDER — LACTATED RINGERS IV SOLN
500.0000 mL | INTRAVENOUS | Status: DC | PRN
  Administered 2016-09-20: 500 mL via INTRAVENOUS

## 2016-09-20 MED ORDER — ZOLPIDEM TARTRATE 5 MG PO TABS: 5.0000 mg | ORAL_TABLET | Freq: Every evening | ORAL | Status: DC | PRN

## 2016-09-20 MED ORDER — SENNOSIDES-DOCUSATE SODIUM 8.6-50 MG PO TABS
2.0000 | ORAL_TABLET | ORAL | Status: DC
  Administered 2016-09-21 – 2016-09-22 (×2): 2 via ORAL
  Filled 2016-09-20 (×2): qty 2

## 2016-09-20 MED ORDER — ONDANSETRON HCL 4 MG/2ML IJ SOLN
4.0000 mg | Freq: Four times a day (QID) | INTRAMUSCULAR | Status: DC | PRN
  Administered 2016-09-20: 4 mg via INTRAVENOUS
  Filled 2016-09-20: qty 2

## 2016-09-20 MED ORDER — SOD CITRATE-CITRIC ACID 500-334 MG/5ML PO SOLN: 30.0000 mL | ORAL | Status: DC | PRN

## 2016-09-20 MED ORDER — DIBUCAINE 1 % RE OINT: 1.0000 "application " | TOPICAL_OINTMENT | RECTAL | Status: DC | PRN

## 2016-09-20 MED ORDER — OXYTOCIN 40 UNITS IN LACTATED RINGERS INFUSION - SIMPLE MED
1.0000 m[IU]/min | INTRAVENOUS | Status: DC
  Filled 2016-09-20: qty 1000

## 2016-09-20 MED ORDER — OXYCODONE-ACETAMINOPHEN 5-325 MG PO TABS
2.0000 | ORAL_TABLET | ORAL | Status: DC | PRN
  Filled 2016-09-20: qty 2

## 2016-09-20 MED ORDER — LIDOCAINE-EPINEPHRINE (PF) 1.5 %-1:200000 IJ SOLN
INTRAMUSCULAR | Status: DC | PRN
  Administered 2016-09-20: 3 mL via EPIDURAL

## 2016-09-20 MED ORDER — DIPHENHYDRAMINE HCL 50 MG/ML IJ SOLN: 12.5000 mg | INTRAMUSCULAR | Status: DC | PRN

## 2016-09-20 MED ORDER — FENTANYL 2.5 MCG/ML W/ROPIVACAINE 0.15% IN NS 100 ML EPIDURAL (ARMC)
EPIDURAL | Status: AC
  Filled 2016-09-20: qty 100

## 2016-09-20 MED ORDER — OXYTOCIN 10 UNIT/ML IJ SOLN
10.0000 [IU] | Freq: Once | INTRAMUSCULAR | Status: AC
  Administered 2016-09-20: 10 [IU] via INTRAMUSCULAR

## 2016-09-20 MED ORDER — IBUPROFEN 600 MG PO TABS
600.0000 mg | ORAL_TABLET | Freq: Four times a day (QID) | ORAL | Status: DC
  Administered 2016-09-21 (×2): 600 mg via ORAL
  Filled 2016-09-20 (×2): qty 1

## 2016-09-20 MED ORDER — ONDANSETRON HCL 4 MG/2ML IJ SOLN: 4.0000 mg | INTRAMUSCULAR | Status: DC | PRN

## 2016-09-20 MED ORDER — OXYTOCIN BOLUS FROM INFUSION
500.0000 mL | Freq: Once | INTRAVENOUS | Status: AC
  Administered 2016-09-20: 500 mL via INTRAVENOUS

## 2016-09-20 MED ORDER — ONDANSETRON HCL 4 MG PO TABS
4.0000 mg | ORAL_TABLET | ORAL | Status: DC | PRN
  Administered 2016-09-22: 4 mg via ORAL
  Filled 2016-09-20: qty 1

## 2016-09-20 MED ORDER — FLEET ENEMA 7-19 GM/118ML RE ENEM: 1.0000 | ENEMA | Freq: Every day | RECTAL | Status: DC | PRN

## 2016-09-20 MED ORDER — BENZOCAINE-MENTHOL 20-0.5 % EX AERO
1.0000 "application " | INHALATION_SPRAY | CUTANEOUS | Status: DC | PRN
  Filled 2016-09-20: qty 56

## 2016-09-20 MED ORDER — PHENYLEPHRINE 40 MCG/ML (10ML) SYRINGE FOR IV PUSH (FOR BLOOD PRESSURE SUPPORT)
80.0000 ug | PREFILLED_SYRINGE | INTRAVENOUS | Status: DC | PRN
  Filled 2016-09-20: qty 5

## 2016-09-20 MED ORDER — OXYCODONE-ACETAMINOPHEN 5-325 MG PO TABS: 1.0000 | ORAL_TABLET | ORAL | Status: DC | PRN

## 2016-09-20 MED ORDER — COCONUT OIL OIL
1.0000 "application " | TOPICAL_OIL | Status: DC | PRN
  Administered 2016-09-22: 1 via TOPICAL
  Filled 2016-09-20 (×2): qty 120

## 2016-09-20 MED ORDER — OXYTOCIN 40 UNITS IN LACTATED RINGERS INFUSION - SIMPLE MED: 2.5000 [IU]/h | INTRAVENOUS | Status: DC

## 2016-09-20 MED ORDER — ACETAMINOPHEN 325 MG PO TABS
650.0000 mg | ORAL_TABLET | ORAL | Status: DC | PRN
  Administered 2016-09-22 (×2): 650 mg via ORAL
  Filled 2016-09-20 (×2): qty 2

## 2016-09-20 MED ORDER — ACETAMINOPHEN 325 MG PO TABS: 650.0000 mg | ORAL_TABLET | ORAL | Status: DC | PRN

## 2016-09-20 NOTE — OB Triage Note (Signed)
Pt G5P3 complains of contractions 3-5 min apart since 2000 09/19/16. Pt denies leaking of fluid, but states vaginal discharge that is thick, white, no odor. + FM. VSS. Monitors applied and assessing.

## 2016-09-20 NOTE — Anesthesia Procedure Notes (Signed)
Epidural Patient location during procedure: OB Start time: 09/20/2016 4:14 PM End time: 09/20/2016 4:20 PM  Staffing Anesthesiologist: Lenard Simmer Performed: anesthesiologist   Preanesthetic Checklist Completed: patient identified, site marked, surgical consent, pre-op evaluation, timeout performed, IV checked, risks and benefits discussed and monitors and equipment checked  Epidural Patient position: sitting Prep: ChloraPrep Patient monitoring: heart rate, continuous pulse ox and blood pressure Approach: midline Location: L3-L4 Injection technique: LOR saline  Needle:  Needle type: Tuohy  Needle gauge: 17 G Needle length: 9 cm and 9 Needle insertion depth: 5 cm Catheter type: closed end flexible Catheter size: 19 Gauge Catheter at skin depth: 9 cm Test dose: negative and 1.5% lidocaine with Epi 1:200 K  Assessment Sensory level: T10 Events: blood not aspirated, injection not painful, no injection resistance, negative IV test and no paresthesia  Additional Notes Pt. Evaluated and documentation done after procedure finished. Patient identified. Risks/Benefits/Options discussed with patient including but not limited to bleeding, infection, nerve damage, paralysis, failed block, incomplete pain control, headache, blood pressure changes, nausea, vomiting, reactions to medication both or allergic, itching and postpartum back pain. Confirmed with bedside nurse the patient's most recent platelet count. Confirmed with patient that they are not currently taking any anticoagulation, have any bleeding history or any family history of bleeding disorders. Patient expressed understanding and wished to proceed. All questions were answered. Sterile technique was used throughout the entire procedure. Please see nursing notes for vital signs. Test dose was given through epidural catheter and negative prior to continuing to dose epidural or start infusion. Warning signs of high block given to the  patient including shortness of breath, tingling/numbness in hands, complete motor block, or any concerning symptoms with instructions to call for help. Patient was given instructions on fall risk and not to get out of bed. All questions and concerns addressed with instructions to call with any issues or inadequate analgesia.   Patient tolerated the insertion well without immediate complications.Reason for block:procedure for pain

## 2016-09-20 NOTE — Progress Notes (Signed)
Jaclyn Austin is a 26 y.o. [id number] at [redacted]w[redacted]d by ultrasound admitted for augmentation of latent phase labor due to BPP 0/8.  Subjective:  Pt sitting quietly in bed, reports pain relief since epidural placement.   Denies difficulty breathing or respiratory distress, chest pain, abdominal pain, vaginal bleeding, and leg pain or swelling.   Objective:  Temp:  [98.3 F (36.8 C)-98.4 F (36.9 C)] 98.3 F (36.8 C) (09/15 1620) Pulse Rate:  [89-118] 89 (09/15 1808) Resp:  [18] 18 (09/15 1620) BP: (115-137)/(54-98) 132/67 (09/15 1808) SpO2:  [97 %-100 %] 99 % (09/15 1810) Weight:  [175 lb (79.4 kg)] 175 lb (79.4 kg) (09/15 0745)  FHT:  FHR: 120 bpm, variability: moderate,  accelerations:  Present,  decelerations:  Present prolonged   UC:   Four (4) to five (5) minutes, soft resting tone  SVE:   Dilation: 7.5 Effacement (%): 100 Station: 0 Exam by:: Clearence Vitug   SROM clear fluid, moderate amount  Labs: Lab Results  Component Value Date   WBC 9.6 09/20/2016   HGB 11.2 (L) 09/20/2016   HCT 33.4 (L) 09/20/2016   MCV 86.3 09/20/2016   PLT 310 09/20/2016    Assessment:  Jaclyn Austin is a 26 y.o. [id number] at [redacted]w[redacted]d being admitted for augmentation of labor due to non-reactive NST and BPP 0/8, previous c-section, successful VBAC x 1, desires trial of labor, Rh positive, GBS negative  FHR Category II  Plan:  Continue orders as written. Reassess as needed.   Gunnar Bulla, CNM 09/20/2016, 8:13 PM

## 2016-09-20 NOTE — Anesthesia Preprocedure Evaluation (Signed)
Anesthesia Evaluation  Patient identified by MRN, date of birth, ID band Patient awake    Reviewed: Allergy & Precautions, H&P , NPO status , Patient's Chart, lab work & pertinent test results, reviewed documented beta blocker date and time   History of Anesthesia Complications (+) PONV and history of anesthetic complications  Airway Mallampati: II  TM Distance: >3 FB Neck ROM: full    Dental  (+) Teeth Intact   Pulmonary neg pulmonary ROS,           Cardiovascular Exercise Tolerance: Good negative cardio ROS       Neuro/Psych negative neurological ROS  negative psych ROS   GI/Hepatic Neg liver ROS, GERD  ,  Endo/Other  negative endocrine ROS  Renal/GU negative Renal ROS  negative genitourinary   Musculoskeletal   Abdominal   Peds  Hematology negative hematology ROS (+)   Anesthesia Other Findings Past Medical History: No date: Anemia No date: PONV (postoperative nausea and vomiting)   Reproductive/Obstetrics (+) Pregnancy                             Anesthesia Physical Anesthesia Plan  ASA: II  Anesthesia Plan: Epidural   Post-op Pain Management:    Induction:   PONV Risk Score and Plan:   Airway Management Planned:   Additional Equipment:   Intra-op Plan:   Post-operative Plan:   Informed Consent: I have reviewed the patients History and Physical, chart, labs and discussed the procedure including the risks, benefits and alternatives for the proposed anesthesia with the patient or authorized representative who has indicated his/her understanding and acceptance.   Dental Advisory Given  Plan Discussed with: Anesthesiologist, CRNA and Surgeon  Anesthesia Plan Comments:         Anesthesia Quick Evaluation

## 2016-09-20 NOTE — Progress Notes (Signed)
Jaclyn Austin is a 26 y.o. [id number] at [redacted]w[redacted]d by ultrasound admitted for augmentation of latent phase labor due to BPP 0/8.  Subjective:  Pt sitting quietly in bed, reports pain relief since epidural placement.   Denies difficulty breathing or respiratory distress, chest pain, abdominal pain, vaginal bleeding, and leg pain or swelling.   Objective:  Temp:  [98.3 F (36.8 C)-98.4 F (36.9 C)] 98.3 F (36.8 C) (09/15 1620) Pulse Rate:  [90-118] 91 (09/15 1653) Resp:  [18] 18 (09/15 1620) BP: (115-137)/(54-98) 115/54 (09/15 1653) SpO2:  [97 %-100 %] 99 % (09/15 1726) Weight:  [175 lb (79.4 kg)] 175 lb (79.4 kg) (09/15 0745)  FHT:  FHR: 120 bpm, variability: moderate,  accelerations:  Present,  decelerations:  Present intermittent variable decelerations-resolves with position change   UC:   Occasional, soft resting tone  SVE:   Dilation: 7 Effacement (%): 100 Station: 0 Exam by:: Geanie Pacifico CNM   SROM clear fluid, moderate amount  Labs: Lab Results  Component Value Date   WBC 9.6 09/20/2016   HGB 11.2 (L) 09/20/2016   HCT 33.4 (L) 09/20/2016   MCV 86.3 09/20/2016   PLT 310 09/20/2016    Assessment:  Jaclyn Austin is a 26 y.o. [id number] at [redacted]w[redacted]d being admitted for augmentation of labor due to non-reactive NST and BPP 0/8, previous c-section, successful VBAC x 1, desires trial of labor, Rh positive, GBS negative  FHR Category II  Plan:  IUPC and FSE placed without difficulty.   VBAC protocol initiated.   Continue orders as written. Reassess as needed.   Gunnar Bulla, CNM 09/20/2016, 5:31 PM

## 2016-09-20 NOTE — H&P (Signed)
Obstetric History and Physical  Jaclyn Austin is a 26 y.o. [phone] with IUP at [redacted]w[redacted]d presenting with lower abdominal pain and contractions since last night. While in OB Triage, patient was noted to have decelerations with each contraction. A BPP was completed and found to be 0/8.   Patient states she has been having irregular contractions, none vaginal bleeding, intact membranes, with active fetal movement.    Denies difficulty breathing or respiratory distress, chest pain, and leg pain or swelling.   Prenatal Course  Source of Care: [facility], transfer of care from [facility] at 27 weeks, seven (7) total visits  Pregnancy complications or risks: previous c-section x 1, successful VBAC x 1  Prenatal labs and studies:  ABO, Rh: --/--/O POS (03/05 1551)  Antibody: Negative (07/02 1635)  Rubella: 3.44 (07/02 1635)  Varicella: 1085 (07/02 1635)  RPR: Non Reactive (07/02 1635)   HBsAg: Negative (07/02 1635)   HIV: Non Reactive (07/02 1635)  GBS: Negative (08/31 1623)  1 hr Glucola: 69 (07/02 1635)  Genetic screening: Declined  Anatomy US: Normal (07/02 1344)  Past Medical History:  Diagnosis Date  . Anemia   . PONV (postoperative nausea and vomiting)     Past Surgical History:  Procedure Laterality Date  . CESAREAN SECTION      OB History  Gravida Para Term Preterm AB Living  SAB TAB Ectopic Multiple Live Births  1     0 3    # Outcome Date GA Lbr Len/2nd Weight Sex Delivery Anes PTL Lv  5 Current           4 Term 06/08/14 [redacted]w[redacted]d 04:24 / 00:02 6 lb 8.2 oz (2.955 kg) F Vag-Spont None  LIV  3 Term 01/14/13 [redacted]w[redacted]d  6 lb (2.722 kg) F CS-LTranv   LIV  2 SAB 05/22/11          1 Term 10/02/08 [redacted]w[redacted]d  6 lb 3 oz (2.807 kg) F Vag-Spont   LIV      Social History   Social History  . Marital status: Single    Spouse name: N/A  . Number of children: N/A  . Years of education: N/A   Social History Main Topics  . Smoking status: Never Smoker  . Smokeless  tobacco: Never Used  . Alcohol use No  . Drug use: No  . Sexual activity: Not Asked   Other Topics Concern  . None   Social History Narrative  . None    Family History  Problem Relation Age of Onset  . Cancer Sister   . Cancer Maternal Grandmother     Prescriptions Prior to Admission  Medication Sig Dispense Refill Last Dose  . Doxylamine-Pyridoxine (DICLEGIS) 10-10 MG TBEC Take 2 tablets by mouth at bedtime. If symptoms persist, add one tablet in the morning and one in the afternoon 120 tablet 3 Past Week at Unknown time  . ketoconazole (NIZORAL) 2 % cream Apply 1 application topically daily. 15 g 0 09/19/2016 at Unknown time  . Prenat-FeCbn-FeAsp-Meth-FA-DHA (PRENATE MINI) 18-0.6-0.4-350 MG CAPS Take 1 tablet by mouth daily. 30 capsule 11 09/19/2016 at Unknown time  . ranitidine (ZANTAC) 75 MG tablet Take 1 tablet (75 mg total) by mouth daily. 30 tablet 3 09/19/2016 at Unknown time  . ondansetron (ZOFRAN ODT) 4 MG disintegrating tablet Take 1 tablet (4 mg total) by mouth every 8 (eight) hours as needed for nausea or vomiting. (Patient not taking: Reported on 09/20/2016) 20  tablet 0 Not Taking at Unknown time    Allergies  Allergen Reactions  . Penicillins Hives and Rash    Review of Systems: Negative except for what is mentioned in HPI.  Physical Exam:  Temp:  [98.4 F (36.9 C)] 98.4 F (36.9 C) (09/15 0745) Pulse Rate:  [90] 90 (09/15 0745) Resp:  [18] 18 (09/15 0745) BP: (134)/(83) 134/83 (09/15 0745) Weight:  [175 lb (79.4 kg)] 175 lb (79.4 kg) (09/15 0745)  GENERAL: Well-developed, well-nourished female in no acute distress.   LUNGS: Clear to auscultation bilaterally.   HEART: Regular rate and rhythm.  ABDOMEN: Soft, nontender, nondistended, gravid.  EXTREMITIES: Nontender, no edema, 2+ distal pulses.  Cervical Exam: Dilation: 4.5 Effacement (%): 80, 90 Station: -3 Exam by:: [employer]:  Baseline rate 125 bpm   Variability moderate  Accelerations  present   Decelerations none  Contractions: Occasional, soft resting tone   Pertinent Labs/Studies:    No results found for this or any previous visit (from the past 24 hour(s)).  Assessment :  Jaclyn Austin is a 26 y.o. [phone] at [redacted]w[redacted]d being admitted for augmentation of labor due to non-reactive NST and BPP 0/8, previous c-section, successful VBAC x 1, desires trial of labor, Rh positive, GBS negative  FHR Category I  Plan:  Options for care reviewed with patient including AROM, pitocin augmentation, and repeat c-section as well as associated risk and benefits.   Patient desires epidural placement followed by AROM.   Will admit to birthing suites, see orders.    Jaclyn Austin, CNM [facility], [facility]

## 2016-09-21 LAB — CBC
HEMATOCRIT: 32.5 % — AB (ref 35.0–47.0)
HEMOGLOBIN: 10.8 g/dL — AB (ref 12.0–16.0)
MCH: 28.8 pg (ref 26.0–34.0)
MCHC: 33.2 g/dL (ref 32.0–36.0)
MCV: 86.7 fL (ref 80.0–100.0)
Platelets: 291 10*3/uL (ref 150–440)
RBC: 3.75 MIL/uL — AB (ref 3.80–5.20)
RDW: 14.9 % — ABNORMAL HIGH (ref 11.5–14.5)
WBC: 15.2 10*3/uL — ABNORMAL HIGH (ref 3.6–11.0)

## 2016-09-21 MED ORDER — IBUPROFEN 600 MG PO TABS
600.0000 mg | ORAL_TABLET | Freq: Four times a day (QID) | ORAL | Status: DC
  Administered 2016-09-21 – 2016-09-22 (×4): 600 mg via ORAL
  Filled 2016-09-21 (×4): qty 1

## 2016-09-21 NOTE — Progress Notes (Signed)
Post Partum Day 1  Subjective:  Patient resting quietly in bed, holding infants. Reports intermittent abdominal cramping, worse than with last pregnancy.   Denies difficulty breathing or respiratory distress, chest pain, excessive vaginal bleeding, and leg pain or swelling.   Objective:  Temp:  [97.8 F (36.6 C)-98.2 F (36.8 C)] 98.1 F (36.7 C) (09/16 1553) Pulse Rate:  [73-114] 84 (09/16 1553) Resp:  [16-18] 16 (09/16 1553) BP: (91-132)/(55-76) 113/67 (09/16 1553) SpO2:  [98 %-100 %] 99 % (09/16 1553)  Physical Exam:   General: alert and cooperative  Lochia: appropriate  Uterine Fundus: firm  Perineum: intact  DVT Evaluation: No evidence of DVT seen on physical exam. Negative Homan's sign.   Recent Labs  09/20/16 1417 09/21/16 0526  HGB 11.2* 10.8*  HCT 33.4* 32.5*    Assessment/Plan: Plan for discharge tomorrow, Breastfeeding and Contraception Nexplanon   LOS: 1 day   Gunnar Bulla, CNM 09/21/2016, 4:45 PM

## 2016-09-21 NOTE — Anesthesia Postprocedure Evaluation (Signed)
Anesthesia Post Note  Patient: Jaclyn Austin  Procedure(s) Performed: * No procedures listed *  Patient location during evaluation: Mother Baby Anesthesia Type: Epidural Level of consciousness: awake and alert Pain management: pain level controlled Vital Signs Assessment: post-procedure vital signs reviewed and stable Respiratory status: spontaneous breathing, nonlabored ventilation and respiratory function stable Cardiovascular status: stable Postop Assessment: no headache, no backache and patient able to bend at knees Anesthetic complications: no     Last Vitals:  Vitals:   09/20/16 2341 09/21/16 0430  BP: 123/64 110/65  Pulse: 93 73  Resp: 18 18  Temp: 36.8 C 36.6 C  SpO2: 98% 99%    Last Pain:  Vitals:   09/21/16 0607  TempSrc:   PainSc: Asleep                 Cleda Mccreedy Piscitello

## 2016-09-22 LAB — RPR: RPR Ser Ql: NONREACTIVE

## 2016-09-22 MED ORDER — IBUPROFEN 600 MG PO TABS: 600.0000 mg | ORAL_TABLET | Freq: Four times a day (QID) | ORAL | 0 refills | Status: DC

## 2016-09-22 NOTE — Discharge Instructions (Signed)
Please call your doctor or return to the ER if you experience any chest pains, shortness of breath, dizziness, visual changes, fever greater than 101, any heavy bleeding (saturating more than 1 pad per hour), large clots, or foul smelling discharge, any worsening abdominal pain and cramping that is not controlled by pain medication, or any signs of postpartum depression. No tampons, enemas, douches, or sexual intercourse for 6 weeks. Also avoid tub baths, hot tubs, or swimming for 6 weeks.  °

## 2016-09-22 NOTE — Progress Notes (Signed)
Discharge order received from doctor. Reviewed discharge instructions and prescriptions with patient and answered all questions. Follow up appointment instructions given. Patient verbalized understanding. ID bands checked. Patient discharged home with infant via wheelchair by nursing/auxillary.    Martice Doty Garner, RN  

## 2016-09-22 NOTE — Discharge Summary (Signed)
Obstetric Discharge Summary  Patient ID: Jaclyn Austin MRN: [id number] DOB/AGE: 02-03-1990 26 y.o.   Date of Admission: 09/20/2016 Jaclyn Austin, CNM Jaclyn Cross, MD)  Date of Discharge: Jaclyn Austin, CNM Jaclyn Cross, MD)  Admitting Diagnosis: Onset of Labor at [redacted]w[redacted]d  Secondary Diagnosis: None  Mode of Delivery: normal spontaneous vaginal delivery, previous c-section  Discharge Diagnosis: No other diagnosis   Intrapartum Procedures: Atificial rupture of membranes, epidural, placement of fetal scalp electrode and placement of intrauterine catheter   Post partum procedures: None  Complications: none   Brief Hospital Course   Jaclyn Austin is a [id number] who had a SVD on 09/20/2016;  for further details of this birth, please refer to the delivey note.  Patient had an uncomplicated postpartum course.  By time of discharge on PPD#2, her pain was controlled on oral pain medications; she had appropriate lochia and was ambulating, voiding without difficulty and tolerating regular diet.  She was deemed stable for discharge to home.    Labs:  CBC Latest Ref Rng & Units 09/21/2016 09/20/2016 08/03/2016  WBC 3.6 - 11.0 K/uL 15.2(H) 9.6 10.3  Hemoglobin 12.0 - 16.0 g/dL 10.8(L) 11.2(L) 11.6(L)  Hematocrit 35.0 - 47.0 % 32.5(L) 33.4(L) 34.9(L)  Platelets 150 - 440 K/uL 291 310 331   O POS  Physical exam:   Temp:  [97.8 F (36.6 C)-98.7 F (37.1 C)] 98.7 F (37.1 C) (09/17 0207) Pulse Rate:  [82-93] 86 (09/17 0207) Resp:  [16-17] 16 (09/17 0207) BP: (113-134)/(67-74) 134/74 (09/17 0207) SpO2:  [99 %-100 %] 100 % (09/17 0207)  General: Alert and no distress  Lochia: Appropriate  Abdomen: Soft, NT  Uterine Fundus: Firm  Perineum: Intact  Extremities: No evidence of DVT seen on physical exam. No lower extremity edema.  Discharge Instructions: Per After Visit Summary.  Activity: Advance as tolerated. Pelvic rest for 6 weeks.  Also refer to After Visit Summary  Diet:  Regular  Medications:  Allergies as of 09/22/2016      Reactions   Penicillins Hives, Rash      Medication List    STOP taking these medications   Doxylamine-Pyridoxine 10-10 MG Tbec Commonly known as:  DICLEGIS   ketoconazole 2 % cream Commonly known as:  NIZORAL   ondansetron 4 MG disintegrating tablet Commonly known as:  ZOFRAN ODT     TAKE these medications   ibuprofen 600 MG tablet Commonly known as:  ADVIL,MOTRIN Take 1 tablet (600 mg total) by mouth every 6 (six) hours.   PRENATE MINI 18-0.6-0.4-350 MG Caps Take 1 tablet by mouth daily.   ranitidine 75 MG tablet Commonly known as:  ZANTAC Take 1 tablet (75 mg total) by mouth daily.            Discharge Care Instructions        Start     Ordered   09/22/16 0000  ibuprofen (ADVIL,MOTRIN) 600 MG tablet  Every 6 hours    Question:  Supervising Provider  Answer:  Hildred Laser   09/22/16 0801     Outpatient follow up:    Postpartum contraception: Nexplanon  Discharged Condition: stable  Discharged to: home   Newborn Data:  Disposition:home with mother  Apgars: APGAR (1 MIN): 9   APGAR (5 MINS): 9    Baby Feeding: Breast   Gunnar Bulla, CNM

## 2016-09-23 ENCOUNTER — Ambulatory Visit (INDEPENDENT_AMBULATORY_CARE_PROVIDER_SITE_OTHER): Payer: Medicaid Other | Admitting: Certified Nurse Midwife

## 2016-09-23 ENCOUNTER — Encounter: Payer: Medicaid Other | Admitting: Certified Nurse Midwife

## 2016-09-23 ENCOUNTER — Encounter: Payer: Self-pay | Admitting: Certified Nurse Midwife

## 2016-09-23 VITALS — BP 119/65 | HR 93 | Ht 65.0 in | Wt 168.9 lb

## 2016-09-23 DIAGNOSIS — Z30017 Encounter for initial prescription of implantable subdermal contraceptive: Secondary | ICD-10-CM

## 2016-09-23 NOTE — Progress Notes (Signed)
Jaclyn Austin is a 26 y.o. year old African American female here for Nexplanon insertion.    Risks/benefits/side effects of Nexplanon have been discussed and her questions have been answered. Specifically, a failure rate of 01/998 has been reported, with an increased failure rate if pt takes St. John's Wort and/or antiseizure medicaitons.    [name] is aware of the common side effect of irregular bleeding, which the incidence of decreases over time.  BP 119/65 (BP Location: Left Arm, Patient Position: Sitting, Cuff Size: Normal)   Pulse 93   Ht  (1.651 m)   Wt 168 lb 14.4 oz (76.6 kg)   Breastfeeding? Yes   BMI 28.11 kg/m    She is right-handed, so her left arm, approximately 4 inches proximal from the elbow, was cleansed with alcohol and anesthetized with 2cc of 2% Lidocaine.  The area was cleansed again with betadine and the Nexplanon was inserted per manufacturer's recommendations without difficulty.  A steri-strip and pressure bandage were applied.  Pt was instructed to keep the area clean and dry, remove pressure bandage in 24 hours, and keep insertion site covered with the steri-strip for 3-5 days.  Back up contraception was recommended for 2 weeks.  She was given a card indicating date Nexplanon was inserted and date it needs to be removed. Follow-up PRN problems.   Gunnar Bulla, CNM

## 2016-09-23 NOTE — Patient Instructions (Signed)
Nexplanon Instructions After Insertion   Keep bandage clean and dry for 24 hours   May use ice/Tylenol/Ibuprofen for soreness or pain   If you develop fever, drainage or increased warmth from incision site-contact office immediately  Etonogestrel implant What is this medicine? ETONOGESTREL (et oh noe JES trel) is a contraceptive (birth control) device. It is used to prevent pregnancy. It can be used for up to 3 years. This medicine may be used for other purposes; ask your health care provider or pharmacist if you have questions. COMMON BRAND NAME(S): Implanon, Nexplanon What should I tell my health care provider before I take this medicine? They need to know if you have any of these conditions: -abnormal vaginal bleeding -blood vessel disease or blood clots -cancer of the breast, cervix, or liver -depression -diabetes -gallbladder disease -headaches -heart disease or recent heart attack -high blood pressure -high cholesterol -kidney disease -liver disease -renal disease -seizures -tobacco smoker -an unusual or allergic reaction to etonogestrel, other hormones, anesthetics or antiseptics, medicines, foods, dyes, or preservatives -pregnant or trying to get pregnant -breast-feeding How should I use this medicine? This device is inserted just under the skin on the inner side of your upper arm by a health care professional. Talk to your pediatrician regarding the use of this medicine in children. Special care may be needed. Overdosage: If you think you have taken too much of this medicine contact a poison control center or emergency room at once. NOTE: This medicine is only for you. Do not share this medicine with others. What if I miss a dose? This does not apply. What may interact with this medicine? Do not take this medicine with any of the following medications: -amprenavir -bosentan -fosamprenavir This medicine may also interact with the following  medications: -barbiturate medicines for inducing sleep or treating seizures -certain medicines for fungal infections like ketoconazole and itraconazole -grapefruit juice -griseofulvin -medicines to treat seizures like carbamazepine, felbamate, oxcarbazepine, phenytoin, topiramate -modafinil -phenylbutazone -rifampin -rufinamide -some medicines to treat HIV infection like atazanavir, indinavir, lopinavir, nelfinavir, tipranavir, ritonavir -St. John's wort This list may not describe all possible interactions. Give your health care provider a list of all the medicines, herbs, non-prescription drugs, or dietary supplements you use. Also tell them if you smoke, drink alcohol, or use illegal drugs. Some items may interact with your medicine. What should I watch for while using this medicine? This product does not protect you against HIV infection (AIDS) or other sexually transmitted diseases. You should be able to feel the implant by pressing your fingertips over the skin where it was inserted. Contact your doctor if you cannot feel the implant, and use a non-hormonal birth control method (such as condoms) until your doctor confirms that the implant is in place. If you feel that the implant may have broken or become bent while in your arm, contact your healthcare provider. What side effects may I notice from receiving this medicine? Side effects that you should report to your doctor or health care professional as soon as possible: -allergic reactions like skin rash, itching or hives, swelling of the face, lips, or tongue -breast lumps -changes in emotions or moods -depressed mood -heavy or prolonged menstrual bleeding -pain, irritation, swelling, or bruising at the insertion site -scar at site of insertion -signs of infection at the insertion site such as fever, and skin redness, pain or discharge -signs of pregnancy -signs and symptoms of a blood clot such as breathing problems; changes in  vision; chest pain; severe,   sudden headache; pain, swelling, warmth in the leg; trouble speaking; sudden numbness or weakness of the face, arm or leg -signs and symptoms of liver injury like dark yellow or brown urine; general ill feeling or flu-like symptoms; light-colored stools; loss of appetite; nausea; right upper belly pain; unusually weak or tired; yellowing of the eyes or skin -unusual vaginal bleeding, discharge -signs and symptoms of a stroke like changes in vision; confusion; trouble speaking or understanding; severe headaches; sudden numbness or weakness of the face, arm or leg; trouble walking; dizziness; loss of balance or coordination Side effects that usually do not require medical attention (report to your doctor or health care professional if they continue or are bothersome): -acne -back pain -breast pain -changes in weight -dizziness -general ill feeling or flu-like symptoms -headache -irregular menstrual bleeding -nausea -sore throat -vaginal irritation or inflammation This list may not describe all possible side effects. Call your doctor for medical advice about side effects. You may report side effects to FDA at 1-800-FDA-1088. Where should I keep my medicine? This drug is given in a hospital or clinic and will not be stored at home. NOTE: This sheet is a summary. It may not cover all possible information. If you have questions about this medicine, talk to your doctor, pharmacist, or health care provider.  2018 Elsevier/Gold Standard (2015-07-12 11:19:22)  

## 2016-10-28 ENCOUNTER — Ambulatory Visit (INDEPENDENT_AMBULATORY_CARE_PROVIDER_SITE_OTHER): Payer: Medicaid Other | Admitting: Certified Nurse Midwife

## 2016-10-28 ENCOUNTER — Encounter: Payer: Self-pay | Admitting: Certified Nurse Midwife

## 2016-10-28 DIAGNOSIS — O99345 Other mental disorders complicating the puerperium: Secondary | ICD-10-CM

## 2016-10-28 DIAGNOSIS — F53 Postpartum depression: Secondary | ICD-10-CM

## 2016-10-28 MED ORDER — SERTRALINE HCL 50 MG PO TABS: 50.0000 mg | ORAL_TABLET | Freq: Every day | ORAL | 1 refills | Status: DC

## 2016-10-28 NOTE — Patient Instructions (Addendum)
Sertraline tablets What is this medicine? SERTRALINE (SER tra leen) is used to treat depression. It may also be used to treat obsessive compulsive disorder, panic disorder, post-trauma stress, premenstrual dysphoric disorder (PMDD) or social anxiety. This medicine may be used for other purposes; ask your health care provider or pharmacist if you have questions. COMMON BRAND NAME(S): Zoloft What should I tell my health care provider before I take this medicine? They need to know if you have any of these conditions: -bleeding disorders -bipolar disorder or a family history of bipolar disorder -glaucoma -heart disease -high blood pressure -history of irregular heartbeat -history of low levels of calcium, magnesium, or potassium in the blood -if you often drink alcohol -liver disease -receiving electroconvulsive therapy -seizures -suicidal thoughts, plans, or attempt; a previous suicide attempt by you or a family member -take medicines that treat or prevent blood clots -thyroid disease -an unusual or allergic reaction to sertraline, other medicines, foods, dyes, or preservatives -pregnant or trying to get pregnant -breast-feeding How should I use this medicine? Take this medicine by mouth with a glass of water. Follow the directions on the prescription label. You can take it with or without food. Take your medicine at regular intervals. Do not take your medicine more often than directed. Do not stop taking this medicine suddenly except upon the advice of your doctor. Stopping this medicine too quickly may cause serious side effects or your condition may worsen. A special MedGuide will be given to you by the pharmacist with each prescription and refill. Be sure to read this information carefully each time. Talk to your pediatrician regarding the use of this medicine in children. While this drug may be prescribed for children as young as 7 years for selected conditions, precautions do  apply. Overdosage: If you think you have taken too much of this medicine contact a poison control center or emergency room at once. NOTE: This medicine is only for you. Do not share this medicine with others. What if I miss a dose? If you miss a dose, take it as soon as you can. If it is almost time for your next dose, take only that dose. Do not take double or extra doses. What may interact with this medicine? Do not take this medicine with any of the following medications: -cisapride -dofetilide -dronedarone -linezolid -MAOIs like Carbex, Eldepryl, Marplan, Nardil, and Parnate -methylene blue (injected into a vein) -pimozide -thioridazine This medicine may also interact with the following medications: -alcohol -amphetamines -aspirin and aspirin-like medicines -certain medicines for depression, anxiety, or psychotic disturbances -certain medicines for fungal infections like ketoconazole, fluconazole, posaconazole, and itraconazole -certain medicines for irregular heart beat like flecainide, quinidine, propafenone -certain medicines for migraine headaches like almotriptan, eletriptan, frovatriptan, naratriptan, rizatriptan, sumatriptan, zolmitriptan -certain medicines for sleep -certain medicines for seizures like carbamazepine, valproic acid, phenytoin -certain medicines that treat or prevent blood clots like warfarin, enoxaparin, dalteparin -cimetidine -digoxin -diuretics -fentanyl -isoniazid -lithium -NSAIDs, medicines for pain and inflammation, like ibuprofen or naproxen -other medicines that prolong the QT interval (cause an abnormal heart rhythm) -rasagiline -safinamide -supplements like St. John's wort, kava kava, valerian -tolbutamide -tramadol -tryptophan This list may not describe all possible interactions. Give your health care provider a list of all the medicines, herbs, non-prescription drugs, or dietary supplements you use. Also tell them if you smoke, drink  alcohol, or use illegal drugs. Some items may interact with your medicine. What should I watch for while using this medicine? Tell your doctor if your symptoms  do not get better or if they get worse. Visit your doctor or health care professional for regular checks on your progress. Because it may take several weeks to see the full effects of this medicine, it is important to continue your treatment as prescribed by your doctor. Patients and their families should watch out for new or worsening thoughts of suicide or depression. Also watch out for sudden changes in feelings such as feeling anxious, agitated, panicky, irritable, hostile, aggressive, impulsive, severely restless, overly excited and hyperactive, or not being able to sleep. If this happens, especially at the beginning of treatment or after a change in dose, call your health care professional. Dennis Bast may get drowsy or dizzy. Do not drive, use machinery, or do anything that needs mental alertness until you know how this medicine affects you. Do not stand or sit up quickly, especially if you are an older patient. This reduces the risk of dizzy or fainting spells. Alcohol may interfere with the effect of this medicine. Avoid alcoholic drinks. Your mouth may get dry. Chewing sugarless gum or sucking hard candy, and drinking plenty of water may help. Contact your doctor if the problem does not go away or is severe. What side effects may I notice from receiving this medicine? Side effects that you should report to your doctor or health care professional as soon as possible: -allergic reactions like skin rash, itching or hives, swelling of the face, lips, or tongue -anxious -black, tarry stools -changes in vision -confusion -elevated mood, decreased need for sleep, racing thoughts, impulsive behavior -eye pain -fast, irregular heartbeat -feeling faint or lightheaded, falls -feeling agitated, angry, or irritable -hallucination, loss of contact with  reality -loss of balance or coordination -loss of memory -painful or prolonged erections -restlessness, pacing, inability to keep still -seizures -stiff muscles -suicidal thoughts or other mood changes -trouble sleeping -unusual bleeding or bruising -unusually weak or tired -vomiting Side effects that usually do not require medical attention (report to your doctor or health care professional if they continue or are bothersome): -change in appetite or weight -change in sex drive or performance -diarrhea -increased sweating -indigestion, nausea -tremors This list may not describe all possible side effects. Call your doctor for medical advice about side effects. You may report side effects to FDA at 1-800-FDA-1088. Where should I keep my medicine? Keep out of the reach of children. Store at room temperature between 15 and 30 degrees C (59 and 86 degrees F). Throw away any unused medicine after the expiration date. NOTE: This sheet is a summary. It may not cover all possible information. If you have questions about this medicine, talk to your doctor, pharmacist, or health care provider.  2018 Elsevier/Gold Standard (2015-12-28 14:17:49) Etonogestrel implant What is this medicine? ETONOGESTREL (et oh noe JES trel) is a contraceptive (birth control) device. It is used to prevent pregnancy. It can be used for up to 3 years. This medicine may be used for other purposes; ask your health care provider or pharmacist if you have questions. COMMON BRAND NAME(S): Implanon, Nexplanon What should I tell my health care provider before I take this medicine? They need to know if you have any of these conditions: -abnormal vaginal bleeding -blood vessel disease or blood clots -cancer of the breast, cervix, or liver -depression -diabetes -gallbladder disease -headaches -heart disease or recent heart attack -high blood pressure -high cholesterol -kidney disease -liver disease -renal  disease -seizures -tobacco smoker -an unusual or allergic reaction to etonogestrel, other hormones, anesthetics or  antiseptics, medicines, foods, dyes, or preservatives -pregnant or trying to get pregnant -breast-feeding How should I use this medicine? This device is inserted just under the skin on the inner side of your upper arm by a health care professional. Talk to your pediatrician regarding the use of this medicine in children. Special care may be needed. Overdosage: If you think you have taken too much of this medicine contact a poison control center or emergency room at once. NOTE: This medicine is only for you. Do not share this medicine with others. What if I miss a dose? This does not apply. What may interact with this medicine? Do not take this medicine with any of the following medications: -amprenavir -bosentan -fosamprenavir This medicine may also interact with the following medications: -barbiturate medicines for inducing sleep or treating seizures -certain medicines for fungal infections like ketoconazole and itraconazole -grapefruit juice -griseofulvin -medicines to treat seizures like carbamazepine, felbamate, oxcarbazepine, phenytoin, topiramate -modafinil -phenylbutazone -rifampin -rufinamide -some medicines to treat HIV infection like atazanavir, indinavir, lopinavir, nelfinavir, tipranavir, ritonavir -St. John's wort This list may not describe all possible interactions. Give your health care provider a list of all the medicines, herbs, non-prescription drugs, or dietary supplements you use. Also tell them if you smoke, drink alcohol, or use illegal drugs. Some items may interact with your medicine. What should I watch for while using this medicine? This product does not protect you against HIV infection (AIDS) or other sexually transmitted diseases. You should be able to feel the implant by pressing your fingertips over the skin where it was inserted. Contact  your doctor if you cannot feel the implant, and use a non-hormonal birth control method (such as condoms) until your doctor confirms that the implant is in place. If you feel that the implant may have broken or become bent while in your arm, contact your healthcare provider. What side effects may I notice from receiving this medicine? Side effects that you should report to your doctor or health care professional as soon as possible: -allergic reactions like skin rash, itching or hives, swelling of the face, lips, or tongue -breast lumps -changes in emotions or moods -depressed mood -heavy or prolonged menstrual bleeding -pain, irritation, swelling, or bruising at the insertion site -scar at site of insertion -signs of infection at the insertion site such as fever, and skin redness, pain or discharge -signs of pregnancy -signs and symptoms of a blood clot such as breathing problems; changes in vision; chest pain; severe, sudden headache; pain, swelling, warmth in the leg; trouble speaking; sudden numbness or weakness of the face, arm or leg -signs and symptoms of liver injury like dark yellow or brown urine; general ill feeling or flu-like symptoms; light-colored stools; loss of appetite; nausea; right upper belly pain; unusually weak or tired; yellowing of the eyes or skin -unusual vaginal bleeding, discharge -signs and symptoms of a stroke like changes in vision; confusion; trouble speaking or understanding; severe headaches; sudden numbness or weakness of the face, arm or leg; trouble walking; dizziness; loss of balance or coordination Side effects that usually do not require medical attention (report to your doctor or health care professional if they continue or are bothersome): -acne -back pain -breast pain -changes in weight -dizziness -general ill feeling or flu-like symptoms -headache -irregular menstrual bleeding -nausea -sore throat -vaginal irritation or inflammation This list  may not describe all possible side effects. Call your doctor for medical advice about side effects. You may report side effects to FDA at  1-800-FDA-1088. Where should I keep my medicine? This drug is given in a hospital or clinic and will not be stored at home. NOTE: This sheet is a summary. It may not cover all possible information. If you have questions about this medicine, talk to your doctor, pharmacist, or health care provider.  2018 Elsevier/Gold Standard (2015-07-12 11:19:22)   Preventive Care 18-39 Years, Female Preventive care refers to lifestyle choices and visits with your health care provider that can promote health and wellness. What does preventive care include?  A yearly physical exam. This is also called an annual well check.  Dental exams once or twice a year.  Routine eye exams. Ask your health care provider how often you should have your eyes checked.  Personal lifestyle choices, including: ? Daily care of your teeth and gums. ? Regular physical activity. ? Eating a healthy diet. ? Avoiding tobacco and drug use. ? Limiting alcohol use. ? Practicing safe sex. ? Taking vitamin and mineral supplements as recommended by your health care provider. What happens during an annual well check? The services and screenings done by your health care provider during your annual well check will depend on your age, overall health, lifestyle risk factors, and family history of disease. Counseling Your health care provider may ask you questions about your:  Alcohol use.  Tobacco use.  Drug use.  Emotional well-being.  Home and relationship well-being.  Sexual activity.  Eating habits.  Work and work Statistician.  Method of birth control.  Menstrual cycle.  Pregnancy history.  Screening You may have the following tests or measurements:  Height, weight, and BMI.  Diabetes screening. This is done by checking your blood sugar (glucose) after you have not eaten  for a while (fasting).  Blood pressure.  Lipid and cholesterol levels. These may be checked every 5 years starting at age 59.  Skin check.  Hepatitis C blood test.  Hepatitis B blood test.  Sexually transmitted disease (STD) testing.  BRCA-related cancer screening. This may be done if you have a family history of breast, ovarian, tubal, or peritoneal cancers.  Pelvic exam and Pap test. This may be done every 3 years starting at age 72. Starting at age 40, this may be done every 5 years if you have a Pap test in combination with an HPV test.  Discuss your test results, treatment options, and if necessary, the need for more tests with your health care provider. Vaccines Your health care provider may recommend certain vaccines, such as:  Influenza vaccine. This is recommended every year.  Tetanus, diphtheria, and acellular pertussis (Tdap, Td) vaccine. You may need a Td booster every 10 years.  Varicella vaccine. You may need this if you have not been vaccinated.  HPV vaccine. If you are 2 or younger, you may need three doses over 6 months.  Measles, mumps, and rubella (MMR) vaccine. You may need at least one dose of MMR. You may also need a second dose.  Pneumococcal 13-valent conjugate (PCV13) vaccine. You may need this if you have certain conditions and were not previously vaccinated.  Pneumococcal polysaccharide (PPSV23) vaccine. You may need one or two doses if you smoke cigarettes or if you have certain conditions.  Meningococcal vaccine. One dose is recommended if you are age 56-21 years and a first-year college student living in a residence hall, or if you have one of several medical conditions. You may also need additional booster doses.  Hepatitis A vaccine. You may need this if you  have certain conditions or if you travel or work in places where you may be exposed to hepatitis A.  Hepatitis B vaccine. You may need this if you have certain conditions or if you travel  or work in places where you may be exposed to hepatitis B.  Haemophilus influenzae type b (Hib) vaccine. You may need this if you have certain risk factors.  Talk to your health care provider about which screenings and vaccines you need and how often you need them. This information is not intended to replace advice given to you by your health care provider. Make sure you discuss any questions you have with your health care provider. Document Released: 02/18/2001 Document Revised: 09/12/2015 Document Reviewed: 10/24/2014 Elsevier Interactive Patient Education  2017 [employer].

## 2016-10-28 NOTE — Progress Notes (Signed)
Subjective:    Jaclyn Austin is a 26 y.o. [phone] African American female who presents for a postpartum visit. She is 6 weeks postpartum following a vaginal birth after cesarean (VBAC) at 38+4 gestational weeks. Anesthesia: epidural. I have fully reviewed the prenatal and intrapartum course.   Postpartum course has been uncomplicated. Baby's course has been uncomplicated. Baby is feeding by breast. Bleeding spotting only. Bowel function is normal. Bladder function is normal.   Patient is not sexually active. Last sexual activity: prior to birth of infant. Contraception method is Nexplanon. Postpartum depression screening: positive. Score 16.  Last pap 01/2016 and was abnormal.  The following portions of the patient's history were reviewed and updated as appropriate: allergies, current medications, past medical history, past surgical history and problem list.  Review of Systems  Pertinent items are noted in HPI.   Objective:   BP (!) 118/96 (BP Location: Right Arm, Patient Position: Sitting, Cuff Size: Normal)   Pulse 91   Ht 5\' 5"  (1.651 m)   Wt 157 lb 4.8 oz (71.4 kg)   BMI 26.18 kg/m   General:  alert, cooperative and no distress   Breasts:  deferred, no complaints  Lungs: clear to auscultation bilaterally  Heart:  regular rate and rhythm  Abdomen: soft, nontender   Vulva: normal  Vagina: normal vagina  Cervix:  closed  Corpus: Well-involuted  Adnexa:  Non-palpable            Edinburgh Postnatal Depression Scale - 10/28/16 1131      Edinburgh Postnatal Depression Scale:  In the Past 7 Days   I have been able to laugh and see the funny side of things. 1   I have looked forward with enjoyment to things. 1   I have blamed myself unnecessarily when things went wrong. 2   I have been anxious or worried for no good reason. 2   I have felt scared or panicky for no good reason. 1   Things have been getting on top of me. 2   I have been so unhappy that I have had difficulty  sleeping. 2   I have felt sad or miserable. 2   I have been so unhappy that I have been crying. 2   The thought of harming myself has occurred to me. 1   Edinburgh Postnatal Depression Scale Total 16      Assessment:   Postpartum exam Six (6) wks s/p vaginal birth after cesarean Breastfeeding Depression screening Contraception counseling   Plan:   Declines therapy at this time.   Rx: Zoloft, see orders.   Meeting with Jaclyn Austin, pregnancy case manager after today's visit.   RTC x 6 weeks for Medication check. MyChart mood check x 3 weeks.   Follow up in: 3 months for annual exam or earlier if needed.    Gunnar BullaJenkins Jaclyn Austin, CNM

## 2016-11-03 ENCOUNTER — Encounter: Payer: Self-pay | Admitting: Certified Nurse Midwife

## 2016-11-13 ENCOUNTER — Encounter: Payer: Medicaid Other | Admitting: Obstetrics and Gynecology

## 2016-12-11 ENCOUNTER — Other Ambulatory Visit: Payer: Self-pay

## 2016-12-11 ENCOUNTER — Ambulatory Visit (INDEPENDENT_AMBULATORY_CARE_PROVIDER_SITE_OTHER): Payer: Medicaid Other | Admitting: Certified Nurse Midwife

## 2016-12-11 ENCOUNTER — Encounter: Payer: Self-pay | Admitting: Certified Nurse Midwife

## 2016-12-11 VITALS — BP 111/78 | HR 90 | Wt 151.8 lb

## 2016-12-11 DIAGNOSIS — F53 Postpartum depression: Secondary | ICD-10-CM | POA: Diagnosis not present

## 2016-12-11 DIAGNOSIS — Z09 Encounter for follow-up examination after completed treatment for conditions other than malignant neoplasm: Secondary | ICD-10-CM

## 2016-12-11 DIAGNOSIS — O99345 Other mental disorders complicating the puerperium: Secondary | ICD-10-CM

## 2016-12-11 MED ORDER — SERTRALINE HCL 50 MG PO TABS: 50.0000 mg | ORAL_TABLET | Freq: Every day | ORAL | 5 refills | Status: AC

## 2016-12-11 NOTE — Patient Instructions (Signed)

## 2016-12-11 NOTE — Progress Notes (Signed)
GYN ENCOUNTER NOTE  Subjective:       Jaclyn Austin is a 26 y.o. [phone] female here for follow up appointment and medication check secondary to postpartum depression.   Pt states "doing better since on Zoloft". Continues to breastfeed and stay at home with three children under four (194) years old. Denies SI/HI.   Denies difficulty breathing or respiratory distress, chest pain, abdominal pain, vaginal bleeding, dysuria, and leg pain or swelling.    Gynecologic History  No LMP recorded.  Contraception: Nexplanon, inserted 09/2016  Last Pap: 01/2016. Results were: abnormal  Obstetric History  OB History  Gravida Para Term Preterm AB Living  6 4 4   1 4   SAB TAB Ectopic Multiple Live Births  1     0 4    # Outcome Date GA Lbr Len/2nd Weight Sex Delivery Anes PTL Lv  6 Term 09/20/16    F Vag-Spont   LIV  5 Term 06/08/14 2171w5d 04:24 / 00:02 6 lb 8.2 oz (2.955 kg) F Vag-Spont None  LIV  4 Term 01/14/13 5079w0d  6 lb 0 oz (2.722 kg) F CS-LTranv   LIV  3 SAB 05/22/11          2 Term 10/02/08 7179w0d  6 lb 3 oz (2.807 kg) F Vag-Spont   LIV  1 Gravida               Past Medical History:  Diagnosis Date  . Anemia   . PONV (postoperative nausea and vomiting)     Past Surgical History:  Procedure Laterality Date  . CESAREAN SECTION      Current Outpatient Medications on File Prior to Visit  Medication Sig Dispense Refill  . Prenat-FeCbn-FeAsp-Meth-FA-DHA (PRENATE MINI) 18-0.6-0.4-350 MG CAPS Take 1 tablet by mouth daily. 30 capsule 11  . ibuprofen (ADVIL,MOTRIN) 600 MG tablet Take 1 tablet (600 mg total) by mouth every 6 (six) hours. (Patient not taking: Reported on 12/11/2016) 30 tablet 0   No current facility-administered medications on file prior to visit.     Allergies  Allergen Reactions  . Penicillins Hives and Rash    Social History   Socioeconomic History  . Marital status: Single    Spouse name: Not on file  . Number of children: Not on file  . Years of education:  Not on file  . Highest education level: Not on file  Social Needs  . Financial resource strain: Not on file  . Food insecurity - worry: Not on file  . Food insecurity - inability: Not on file  . Transportation needs - medical: Not on file  . Transportation needs - non-medical: Not on file  Occupational History  . Not on file  Tobacco Use  . Smoking status: Never Smoker  . Smokeless tobacco: Never Used  Substance and Sexual Activity  . Alcohol use: No  . Drug use: No  . Sexual activity: No    Birth control/protection: Implant    Comment: Nexplanon Inserted 09/23/2016  Other Topics Concern  . Not on file  Social History Narrative  . Not on file    Family History  Problem Relation Age of Onset  . Cancer Sister   . Cancer Maternal Grandmother     The following portions of the patient's history were reviewed and updated as appropriate: allergies, current medications, past family history, past medical history, past social history, past surgical history and problem list.  Review of Systems  Review of Systems - Negative except as noted  above.  History obtained from the patient  Objective:   BP 111/78   Pulse 90   Wt 151 lb 12.8 oz (68.9 kg)   BMI 25.26 kg/m    CONSTITUTIONAL: Well-developed, well-nourished female in no acute distress.   Depression screen PHQ 2/9 12/11/2016  Decreased Interest 0  Down, Depressed, Hopeless 0  PHQ - 2 Score 0  Altered sleeping 1  Tired, decreased energy 1  Change in appetite 1  Feeling bad or failure about yourself  0  Trouble concentrating 0  Moving slowly or fidgety/restless 0  Suicidal thoughts 0  PHQ-9 Score 3  Difficult doing work/chores Not difficult at all   Assessment:   1. Postpartum depression  2. Follow up  Plan:   Rx: Zoloft, see orders.   Discussed free, local activities available for young children. Schedule of public library events given.   Reviewed red flag symptoms and when to call.   RTC x 1 month for  annual exam or sooner if needed.    Gunnar BullaJenkins Michelle Mckyle Solanki, CNM [facility], [facility]

## 2016-12-28 ENCOUNTER — Encounter: Payer: Self-pay | Admitting: Emergency Medicine

## 2016-12-28 ENCOUNTER — Other Ambulatory Visit: Payer: Self-pay

## 2016-12-28 ENCOUNTER — Emergency Department
Admission: EM | Admit: 2016-12-28 | Discharge: 2016-12-28 | Disposition: A | Discharge status: Home / Self Care | Payer: Medicaid Other | Attending: Emergency Medicine | Admitting: Emergency Medicine

## 2016-12-28 DIAGNOSIS — Z79899 Other long term (current) drug therapy: Secondary | ICD-10-CM | POA: Diagnosis not present

## 2016-12-28 DIAGNOSIS — J029 Acute pharyngitis, unspecified: Secondary | ICD-10-CM | POA: Diagnosis present

## 2016-12-28 MED ORDER — AZITHROMYCIN 250 MG PO TABS: ORAL_TABLET | ORAL | 0 refills | Status: DC

## 2016-12-28 NOTE — ED Provider Notes (Signed)
[facility] Emergency Department Provider Note  ____________________________________________  Time seen: Approximately 4:32 PM  I have reviewed the triage vital signs and the nursing notes.   HISTORY  Chief Complaint Sore Throat    HPI Jaclyn Austin is a 26 y.o. female who presents to the emergency department for treatment and evaluation of sore throat.  Symptoms started yesterday.  This morning, she noticed that she has "white patches" on her tonsils.  She denies other symptoms such as cough.  She has not taken any medications for the symptoms.  She reports a long-standing history of frequent "strep throat" which she feels that she has today.  She denies any known exposure.  Past Medical History:  Diagnosis Date  . Anemia   . PONV (postoperative nausea and vomiting)     Patient Active Problem List   Diagnosis Date Noted  . History of pregnancy induced hypertension 04/02/2016  . History of cesarean section 04/02/2016    Past Surgical History:  Procedure Laterality Date  . CESAREAN SECTION      Prior to Admission medications   Medication Sig Start Date End Date Taking? Authorizing Provider  azithromycin (ZITHROMAX) 250 MG tablet 2 tablets today, then 1 tablet for the next 4 days. 12/28/16   Jet Armbrust, Rulon Eisenmengerari B, FNP  ibuprofen (ADVIL,MOTRIN) 600 MG tablet Take 1 tablet (600 mg total) by mouth every 6 (six) hours. Patient not taking: Reported on 12/11/2016 09/22/16   Gunnar BullaLawhorn, Jenkins Michelle, CNM  Prenat-FeCbn-FeAsp-Meth-FA-DHA (PRENATE MINI) 18-0.6-0.4-350 MG CAPS Take 1 tablet by mouth daily. 05/15/16   Tresea MallGledhill, Jane, CNM  sertraline (ZOLOFT) 50 MG tablet Take 1 tablet (50 mg total) by mouth daily. 12/11/16   Gunnar BullaLawhorn, Jenkins Michelle, CNM    Allergies Penicillins  Family History  Problem Relation Age of Onset  . Cancer Sister   . Cancer Maternal Grandmother     Social History Social History   Tobacco Use  . Smoking status: Never Smoker  .  Smokeless tobacco: Never Used  Substance Use Topics  . Alcohol use: No  . Drug use: No    Review of Systems Constitutional: Negative for fever. Eyes: No visual changes. ENT: Positive for sore throat; negative for difficulty swallowing. Respiratory: Denies shortness of breath. Gastrointestinal: No abdominal pain.  No nausea, no vomiting.  No diarrhea.  Genitourinary: Negative for dysuria. Musculoskeletal: Negative for generalized body aches. Skin: Negative for rash. Neurological: Negative  for headaches, negative for focal weakness or numbness.  ____________________________________________   PHYSICAL EXAM:  VITAL SIGNS: ED Triage Vitals [12/28/16 1230]  Enc Vitals Group     BP 135/81     Pulse Rate (!) 101     Resp 16     Temp 98.9 F (37.2 C)     Temp Source Oral     SpO2 97 %     Weight      Height      Head Circumference      Peak Flow      Pain Score 5     Pain Loc      Pain Edu?      Excl. in GC?    Constitutional: Alert and oriented. Well appearing and in no acute distress. Eyes: Conjunctivae are normal.  Head: Atraumatic. Nose: No congestion/rhinnorhea. Mouth/Throat: Mucous membranes are moist.  Oropharynx erythematous, tonsils 2+ with exudate. Uvula is midline. Neck: No stridor.  Lymphatic: Lymphadenopathy: Bilateral anterior cervical lymphadenopathy palpable and mildly tender Cardiovascular: Normal rate, regular rhythm. Good peripheral circulation. Respiratory:  Normal respiratory effort. Lungs CTAB. Gastrointestinal: Soft and nontender. Musculoskeletal: No lower extremity tenderness nor edema.  Neurologic:  Normal speech and language. No gross focal neurologic deficits are appreciated. Speech is normal. No gait instability. Skin:  Skin is warm, dry and intact. No rash noted Psychiatric: Mood and affect are normal. Speech and behavior are normal.  ____________________________________________   LABS (all labs ordered are listed, but only abnormal  results are displayed)  Labs Reviewed - No data to display ____________________________________________  EKG  Not indicated ____________________________________________  RADIOLOGY  Not indicated ____________________________________________   PROCEDURES  Procedure(s) performed: None  Critical Care performed: No ____________________________________________   INITIAL IMPRESSION / ASSESSMENT AND PLAN / ED COURSE  26 year old female presenting to the emergency department for evaluation of sore throat.  Exam and symptoms are consistent with a streptococcal pharyngitis.  She will be treated with azithromycin as she is allergic to penicillin.  She was instructed to follow-up with the primary care provider for choice for symptoms that are not improving over the next few days.  She was advised to take Tylenol if needed for pain.  She was instructed to return to the emergency department for symptoms of change or worsen if she is unable to schedule an appointment.  Pertinent labs & imaging results that were available during my care of the patient were reviewed by me and considered in my medical decision making (see chart for details). ____________________________________________  This SmartLink is deprecated. Use AVSMEDLIST instead to display the medication list for a patient.  FINAL CLINICAL IMPRESSION(S) / ED DIAGNOSES  Final diagnoses:  Acute pharyngitis, unspecified etiology    If controlled substance prescribed during this visit, 12 month history viewed on the [place] prior to issuing an initial prescription for Schedule II or III opiod.   Note:  This document was prepared using Dragon voice recognition software and may include unintentional dictation errors.    Chinita Pesterriplett, Reathel Turi B, FNP 12/28/16 1636    Arnaldo NatalMalinda, Paul F, MD 12/28/16 [phone]

## 2016-12-28 NOTE — Discharge Instructions (Signed)
Please follow-up with the primary care provider of your choice for symptoms that are not improving with your medications. You may take Tylenol in addition to the azithromycin if needed for pain or fever.

## 2016-12-28 NOTE — ED Triage Notes (Signed)
Pt to ED c/o sore throat since yesterday. Pt states that she has white spots on the back of her throat. Pt denies fever. Pt in NAD at this time.

## 2017-01-29 ENCOUNTER — Encounter: Payer: Medicaid Other | Admitting: Certified Nurse Midwife

## 2017-02-05 ENCOUNTER — Encounter: Payer: Medicaid Other | Admitting: Certified Nurse Midwife

## 2017-05-01 IMAGING — US US OB LIMITED
1 series · 14 of 28 positions shown · non-contrast
Comparison: none

CLINICAL DATA: 25-year-old pregnant female presenting with lower
abdominal pain and vaginal bleeding.

EXAM:
LIMITED OBSTETRIC ULTRASOUND

[Series 1: us ob limited · 0.26mm/px · 14 of 35 slices shown]
[im 2/35]
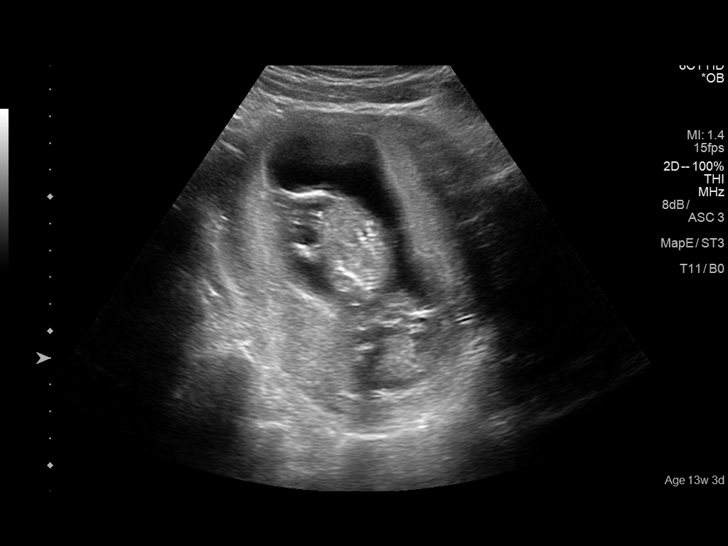
[im 4/35]
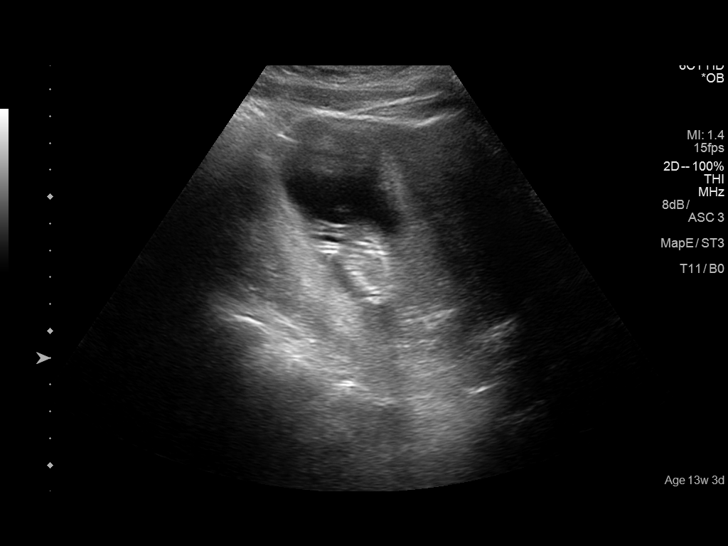
[im 7/35]
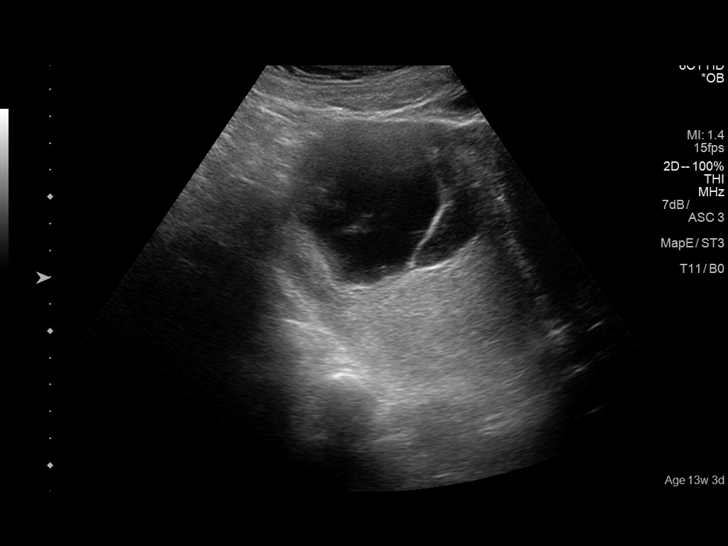
[im 9/35]
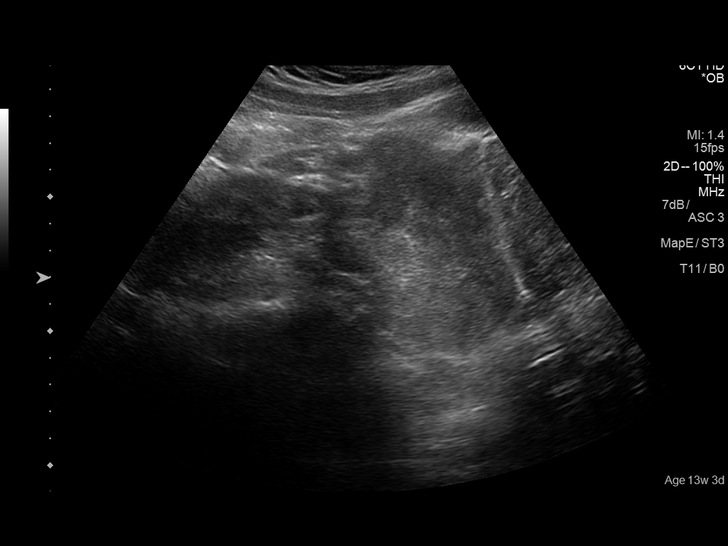
[im 12/35]
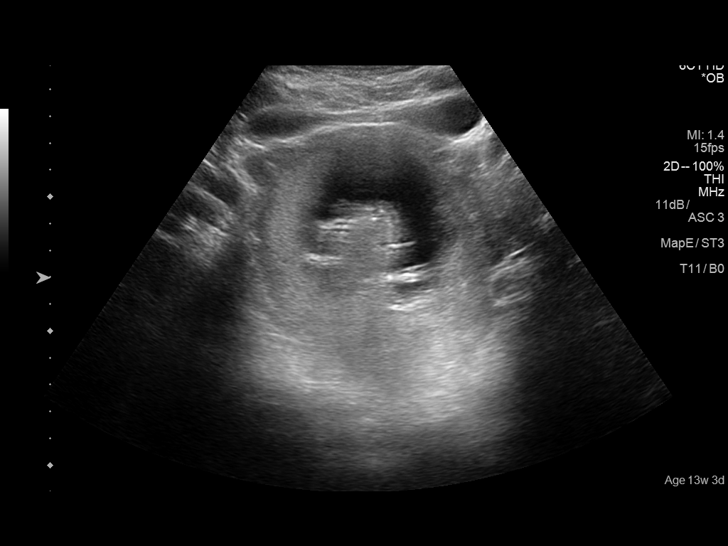
[im 14/35]
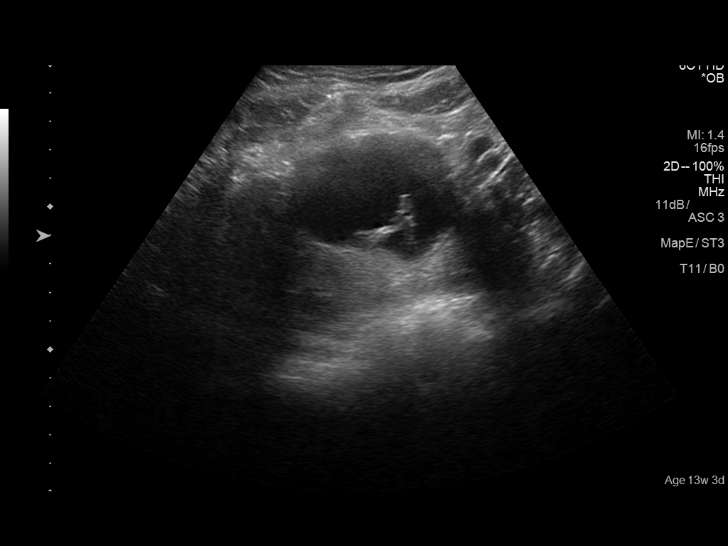
[im 17/35]
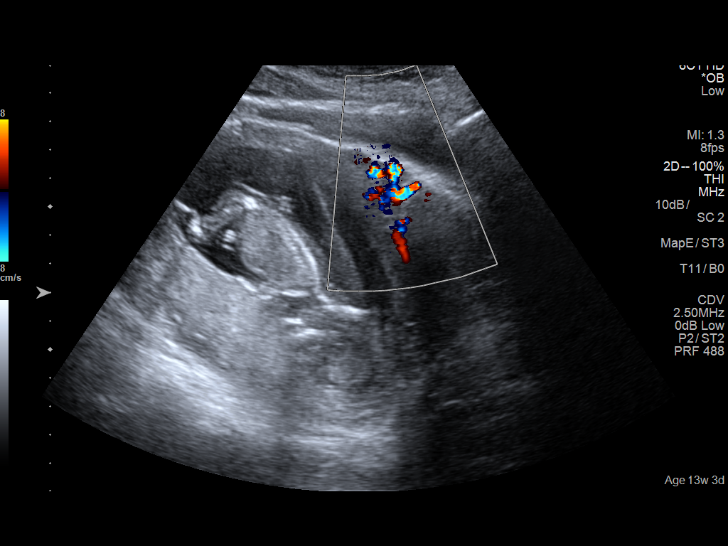
[im 19/35]
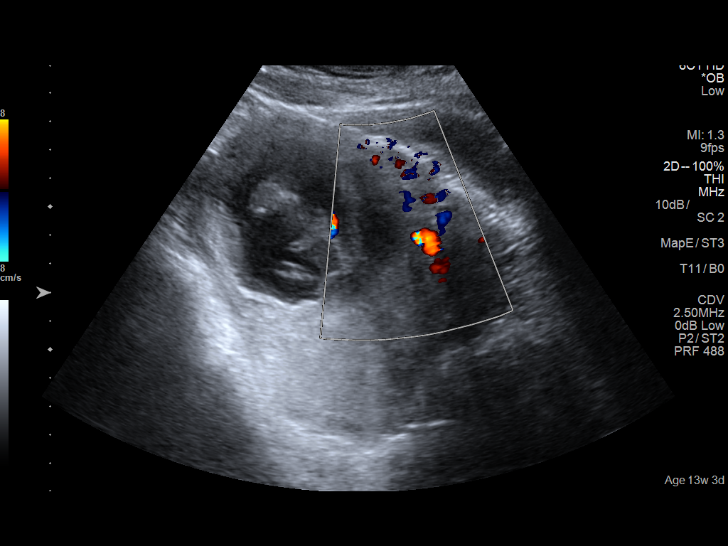
[im 22/35]
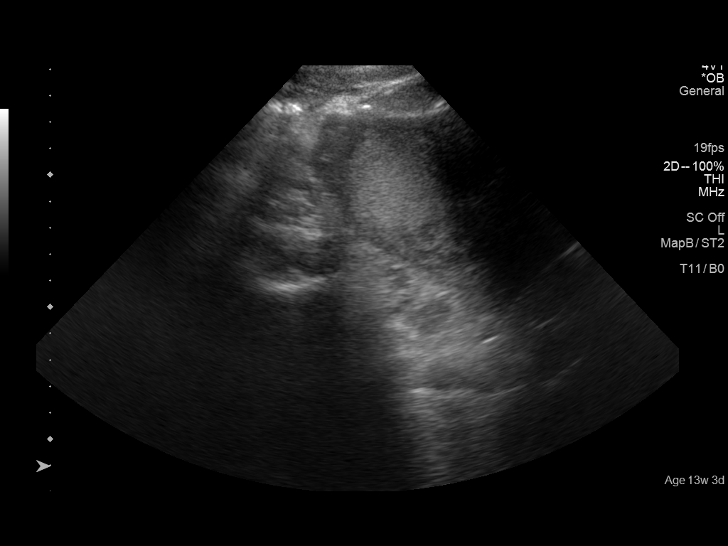
[im 24/35]
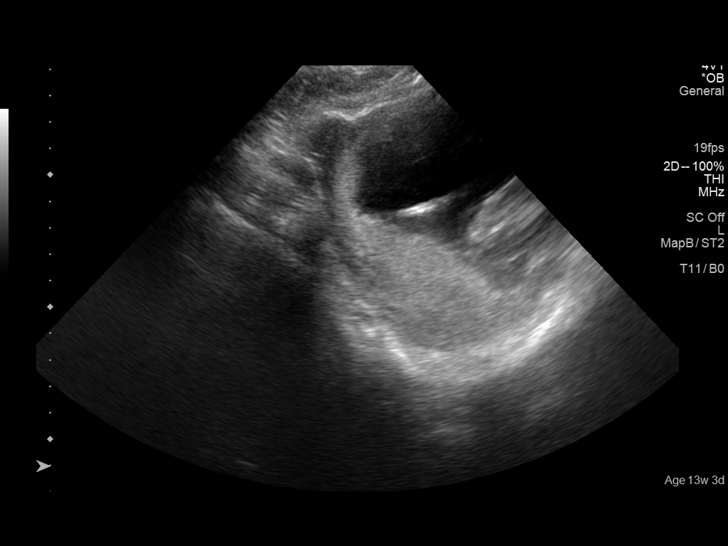
[im 27/35]
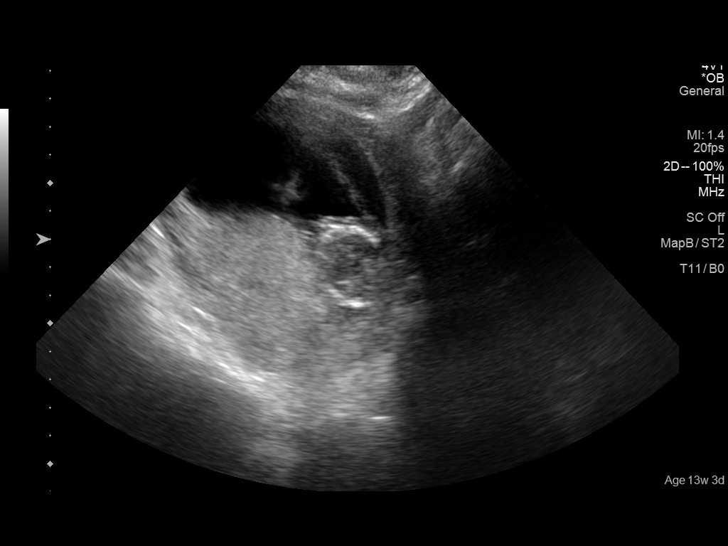
[im 29/35]
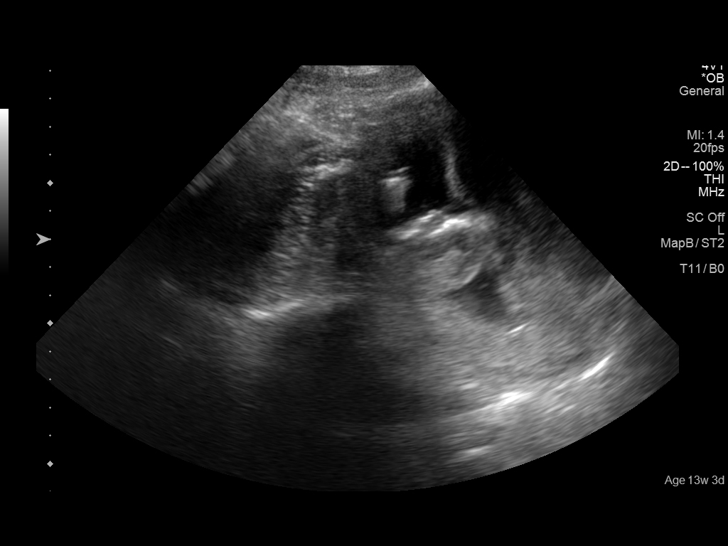
[im 32/35]
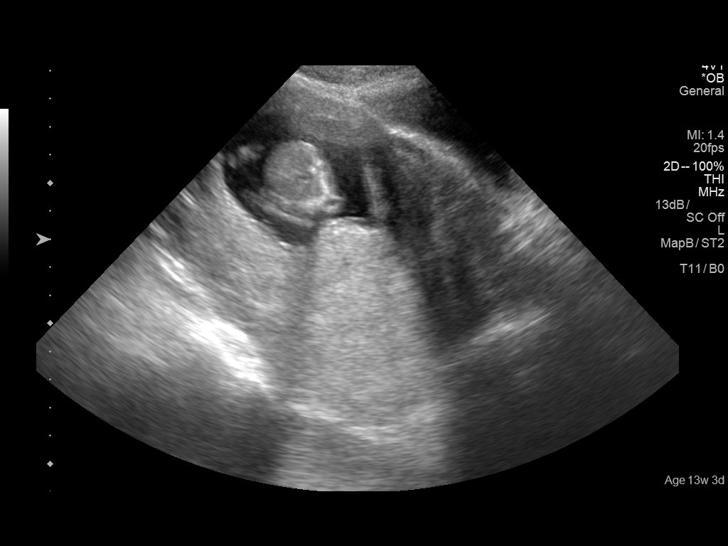
[im 35/35]
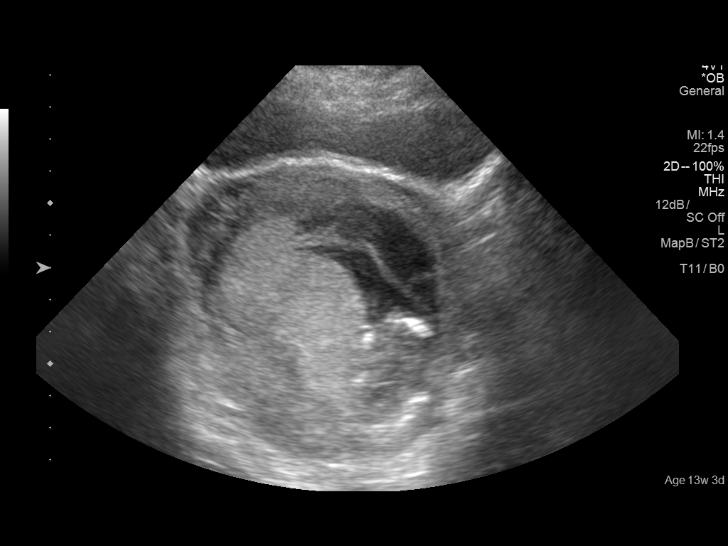

[14 of 28 positions shown; findings below may reference images not displayed]

FINDINGS: Number of Fetuses: 1

Heart Rate:  149 bpm

Movement: Detected

Presentation: Cephalic

Placental Location: Posterior

Previa: The tip of the placenta and the internal os region is not
well visualized.

Amniotic Fluid (Subjective):  Within normal limits.

BPD:  2.6cm 14w  3d

MATERNAL FINDINGS:

Cervix:  Not well seen.

Uterus/Adnexae:  The maternal ovaries are not well visualized.

There is a 7.3 x 1.5 x 1.2 cm subchorionic hemorrhage.
IMPRESSION: 1. Single live intrauterine pregnancy with an estimated gestational
age of 14 weeks, 3 days based on today's measurements.
2. Moderate-sized perigestational hemorrhage, decreased in size
compared to the prior ultrasound.

This exam is performed on an emergent basis and does not
comprehensively evaluate fetal size, dating, or anatomy; follow-up
complete OB US should be considered if further fetal assessment is
warranted.

## 2017-12-19 ENCOUNTER — Other Ambulatory Visit: Payer: Self-pay

## 2017-12-19 ENCOUNTER — Emergency Department
Admission: EM | Admit: 2017-12-19 | Discharge: 2017-12-19 | Disposition: A | Discharge status: Home / Self Care | Payer: Medicaid Other | Attending: Emergency Medicine | Admitting: Emergency Medicine

## 2017-12-19 DIAGNOSIS — Z880 Allergy status to penicillin: Secondary | ICD-10-CM | POA: Diagnosis not present

## 2017-12-19 DIAGNOSIS — Z9109 Other allergy status, other than to drugs and biological substances: Secondary | ICD-10-CM | POA: Diagnosis not present

## 2017-12-19 DIAGNOSIS — L309 Dermatitis, unspecified: Secondary | ICD-10-CM | POA: Insufficient documentation

## 2017-12-19 DIAGNOSIS — R21 Rash and other nonspecific skin eruption: Secondary | ICD-10-CM | POA: Diagnosis present

## 2017-12-19 DIAGNOSIS — R238 Other skin changes: Secondary | ICD-10-CM | POA: Diagnosis not present

## 2017-12-19 MED ORDER — TRIAMCINOLONE ACETONIDE 0.025 % EX OINT: 1.0000 "application " | TOPICAL_OINTMENT | Freq: Two times a day (BID) | CUTANEOUS | 0 refills | Status: DC

## 2017-12-19 MED ORDER — METHYLPREDNISOLONE SODIUM SUCC 125 MG IJ SOLR
125.0000 mg | Freq: Once | INTRAMUSCULAR | Status: AC
  Administered 2017-12-19: 125 mg via INTRAMUSCULAR
  Filled 2017-12-19: qty 2

## 2017-12-19 NOTE — ED Triage Notes (Signed)
Rash to neck x 1 week. Here with kids who are being seen for flu like symptoms. A&O, ambulatory. No distress noted.

## 2017-12-19 NOTE — ED Provider Notes (Signed)
[facility] Emergency Department Provider Note  ____________________________________________  Time seen: Approximately 1:23 PM  I have reviewed the triage vital signs and the nursing notes.   HISTORY  Chief Complaint Rash    HPI Jaclyn Austin is a 27 y.o. female presents emergency department for evaluation of rash to neck and abdomen for 1 week.  Patient states that she is concerned that the rash to her neck is eczema.  She is concerned that the rash to her abdomen is where her pants button rubs against her skin as she is allergic to nickel.  She recently started using a new facial wash but does not use this on her neck.  She has a history of eczema.  She had this rash once before and was given a cream but does not remember what cream it was.  No recent illness.  No one else has a rash.  Her four children all have influenza.   Past Medical History:  Diagnosis Date  . Anemia   . PONV (postoperative nausea and vomiting)     Patient Active Problem List   Diagnosis Date Noted  . History of pregnancy induced hypertension 04/02/2016  . History of cesarean section 04/02/2016    Past Surgical History:  Procedure Laterality Date  . CESAREAN SECTION      Prior to Admission medications   Medication Sig Start Date End Date Taking? Authorizing Provider  azithromycin (ZITHROMAX) 250 MG tablet 2 tablets today, then 1 tablet for the next 4 days. 12/28/16   Triplett, Rulon Eisenmenger B, FNP  ibuprofen (ADVIL,MOTRIN) 600 MG tablet Take 1 tablet (600 mg total) by mouth every 6 (six) hours. Patient not taking: Reported on 12/11/2016 09/22/16   Gunnar Bulla, CNM  Prenat-FeCbn-FeAsp-Meth-FA-DHA (PRENATE MINI) 18-0.6-0.4-350 MG CAPS Take 1 tablet by mouth daily. 05/15/16   Tresea Mall, CNM  sertraline (ZOLOFT) 50 MG tablet Take 1 tablet (50 mg total) by mouth daily. 12/11/16   Lawhorn, Vanessa [place], CNM  triamcinolone (KENALOG) 0.025 % ointment Apply 1 application  topically 2 (two) times daily. 12/19/17   Enid Derry, PA-C    Allergies Penicillins  Family History  Problem Relation Age of Onset  . Cancer Sister   . Cancer Maternal Grandmother     Social History Social History   Tobacco Use  . Smoking status: Never Smoker  . Smokeless tobacco: Never Used  Substance Use Topics  . Alcohol use: No  . Drug use: No     Review of Systems  Constitutional: No fever/chills Cardiovascular: No chest pain. Respiratory: No SOB. Gastrointestinal: No nausea, no vomiting.  Musculoskeletal: Negative for musculoskeletal pain. Skin: Negative for abrasions, lacerations, ecchymosis. Positive for rash.    ____________________________________________   PHYSICAL EXAM:  VITAL SIGNS: ED Triage Vitals [12/19/17 1256]  Enc Vitals Group     BP 134/84     Pulse Rate (!) 104     Resp 18     Temp 98.8 F (37.1 C)     Temp Source Oral     SpO2 95 %     Weight 135 lb (61.2 kg)     Height 5\' 4"  (1.626 m)     Head Circumference      Peak Flow      Pain Score 0     Pain Loc      Pain Edu?      Excl. in GC?      Constitutional: Alert and oriented. Well appearing and in no acute distress. Eyes:  Conjunctivae are normal. PERRL. EOMI. Head: Atraumatic. ENT:      Ears:      Nose: No congestion/rhinnorhea.      Mouth/Throat: Mucous membranes are moist.  Neck: No stridor.  Cardiovascular: Normal rate, regular rhythm.  Good peripheral circulation. Respiratory: Normal respiratory effort without tachypnea or retractions. Lungs CTAB. Good air entry to the bases with no decreased or absent breath sounds. Musculoskeletal: Full range of motion to all extremities. No gross deformities appreciated.  Neurologic:  Normal speech and language. No gross focal neurologic deficits are appreciated.  Skin:  Skin is warm, dry.  Several small dry scaling plaques to left neck.  Mild skin irritation to low abdomen where pants button meets skin. Psychiatric: Mood and  affect are normal. Speech and behavior are normal. Patient exhibits appropriate insight and judgement.   ____________________________________________   LABS (all labs ordered are listed, but only abnormal results are displayed)  Labs Reviewed - No data to display ____________________________________________  EKG   ____________________________________________  RADIOLOGY   No results found.  ____________________________________________    PROCEDURES  Procedure(s) performed:    Procedures    Medications  methylPREDNISolone sodium succinate (SOLU-MEDROL) 125 mg/2 mL injection 125 mg (125 mg Intramuscular Given 12/19/17 1335)     ____________________________________________   INITIAL IMPRESSION / ASSESSMENT AND PLAN / ED COURSE  Pertinent labs & imaging results that were available during my care of the patient were reviewed by me and considered in my medical decision making (see chart for details).  Review of the [place] CSRS was performed in accordance of the [facility] prior to dispensing any controlled drugs.    Patient presented to the emergency department for evaluation of rash. Rash is consistent with eczema to neck and skin irritation from nickel allergy to abdomen.  Patient will apply clear nail polish to the back of her buttons on her pants.  IM solumedrol was given. Patient will be discharged home with prescriptions for triamcinalone. Patient is to follow up with PCP as directed. Patient is given ED precautions to return to the ED for any worsening or new symptoms.     ____________________________________________  FINAL CLINICAL IMPRESSION(S) / ED DIAGNOSES  Final diagnoses:  Rash and nonspecific skin eruption  Eczema, unspecified type  Skin irritation  Nickel allergy      NEW MEDICATIONS STARTED DURING THIS VISIT:  ED Discharge Orders         Ordered    triamcinolone (KENALOG) 0.025 % ointment  2 times daily     12/19/17 1351               This chart was dictated using voice recognition software/Dragon. Despite best efforts to proofread, errors can occur which can change the meaning. Any change was purely unintentional.    Enid DerryWagner, Chrisma Hurlock, PA-C 12/19/17 1602    Don PerkingVeronese, [place], MD 01/14/18 [phone]

## 2018-03-07 ENCOUNTER — Encounter: Payer: Self-pay | Admitting: Emergency Medicine

## 2018-03-07 ENCOUNTER — Other Ambulatory Visit: Payer: Self-pay

## 2018-03-07 ENCOUNTER — Emergency Department
Admission: EM | Admit: 2018-03-07 | Discharge: 2018-03-07 | Disposition: A | Discharge status: Home / Self Care | Payer: Medicaid Other | Attending: Emergency Medicine | Admitting: Emergency Medicine

## 2018-03-07 DIAGNOSIS — H1033 Unspecified acute conjunctivitis, bilateral: Secondary | ICD-10-CM | POA: Diagnosis not present

## 2018-03-07 DIAGNOSIS — H5789 Other specified disorders of eye and adnexa: Secondary | ICD-10-CM | POA: Diagnosis present

## 2018-03-07 DIAGNOSIS — Z79899 Other long term (current) drug therapy: Secondary | ICD-10-CM | POA: Insufficient documentation

## 2018-03-07 MED ORDER — GENTAMICIN SULFATE 0.3 % OP SOLN: 2.0000 [drp] | Freq: Three times a day (TID) | OPHTHALMIC | 0 refills | Status: AC

## 2018-03-07 NOTE — ED Triage Notes (Signed)
Pt presents to ED via POV with 4 daughters who are also patients, reports oldest daughter began having redness to bilateral eyes last night, this morning began having itching to bilateral eyes, would like to be evaluated for pink eye.

## 2018-03-07 NOTE — Discharge Instructions (Signed)
Follow-up with your primary care provider if any continued problems.  Begin using gentamicin ophthalmic solution 2 drops to both eyes 3 times a day until completely cleared.  Wash your hands after you apply the drops in both your eyes and your children's eyes.

## 2018-03-07 NOTE — ED Provider Notes (Signed)
[facility] Emergency Department Provider Note  ____________________________________________   First MD Initiated Contact with Patient 03/07/18 1302     (approximate)  I have reviewed the triage vital signs and the nursing notes.   HISTORY  Chief Complaint Conjunctivitis   HPI Jaclyn Austin is a 28 y.o. female presents to the ED with 4 daughters who are showing signs of conjunctivitis.  Mother states that this morning her eyes began itching bilaterally and wants to be evaluated for pinkeye.  She denies any other symptoms.     Past Medical History:  Diagnosis Date  . Anemia   . PONV (postoperative nausea and vomiting)     Patient Active Problem List   Diagnosis Date Noted  . History of pregnancy induced hypertension 04/02/2016  . History of cesarean section 04/02/2016    Past Surgical History:  Procedure Laterality Date  . CESAREAN SECTION      Prior to Admission medications   Medication Sig Start Date End Date Taking? Authorizing Provider  gentamicin (GARAMYCIN) 0.3 % ophthalmic solution Place 2 drops into both eyes 3 (three) times daily for 10 days. 03/07/18 03/17/18  Bridget Hartshorn L, PA-C  Prenat-FeCbn-FeAsp-Meth-FA-DHA (PRENATE MINI) 18-0.6-0.4-350 MG CAPS Take 1 tablet by mouth daily. 05/15/16   Tresea Mall, CNM  sertraline (ZOLOFT) 50 MG tablet Take 1 tablet (50 mg total) by mouth daily. 12/11/16   Gunnar Bulla, CNM    Allergies Penicillins  Family History  Problem Relation Age of Onset  . Cancer Sister   . Cancer Maternal Grandmother     Social History Social History   Tobacco Use  . Smoking status: Never Smoker  . Smokeless tobacco: Never Used  Substance Use Topics  . Alcohol use: No  . Drug use: No    Review of Systems Constitutional: No fever/chills Eyes: Itching bilaterally. ENT: No sore throat. Cardiovascular: Denies chest pain. Respiratory: Denies shortness of breath. Musculoskeletal: Negative for  back pain. Skin: Negative for rash. Neurological: Negative for headaches ___________________________________________   PHYSICAL EXAM:  VITAL SIGNS: ED Triage Vitals [03/07/18 1250]  Enc Vitals Group     BP 110/71     Pulse Rate 95     Resp 18     Temp 98.5 F (36.9 C)     Temp Source Oral     SpO2 97 %     Weight 130 lb (59 kg)     Height 5\' 4"  (1.626 m)     Head Circumference      Peak Flow      Pain Score 0     Pain Loc      Pain Edu?      Excl. in GC?    Constitutional: Alert and oriented. Well appearing and in no acute distress. Eyes: Conjunctivae are minimally pink, watery.  PERRL. EOMI. Head: Atraumatic. Nose: No congestion/rhinnorhea. Neck: No stridor.   Respiratory: Normal respiratory effort.  Musculoskeletal: Moves upper and lower extremities without any difficulty.  Normal gait was noted. Neurologic:  Normal speech and language. No gross focal neurologic deficits are appreciated. Skin:  Skin is warm, dry and intact. Psychiatric: Mood and affect are normal. Speech and behavior are normal.  ____________________________________________   LABS (all labs ordered are listed, but only abnormal results are displayed)  Labs Reviewed - No data to display   PROCEDURES  Procedure(s) performed (including Critical Care):  Procedures   ____________________________________________   INITIAL IMPRESSION / ASSESSMENT AND PLAN / ED COURSE  As part of  my medical decision making, I reviewed the following data within the electronic MEDICAL RECORD NUMBER Notes from prior ED visits and [place] Controlled Substance Database  ED with report of itching as and 4 daughters with conjunctivitis.  We discussed the high probability that she is starting to get pinkeye and that we will go ahead and treat her with antibiotics.  Patient's 4 daughters were examined and each have evidence of conjunctivitis.  Mother was given instructions on how to care for her children as well as  herself. ____________________________________________   FINAL CLINICAL IMPRESSION(S) / ED DIAGNOSES  Final diagnoses:  Acute bacterial conjunctivitis of both eyes     ED Discharge Orders         Ordered    gentamicin (GARAMYCIN) 0.3 % ophthalmic solution  3 times daily     03/07/18 1343           Note:  This document was prepared using Dragon voice recognition software and may include unintentional dictation errors.    Tommi Rumps, PA-C 03/07/18 1415    Arnaldo Natal, MD 03/07/18 [phone]

## 2018-03-25 ENCOUNTER — Emergency Department
Admission: EM | Admit: 2018-03-25 | Discharge: 2018-03-25 | Disposition: A | Discharge status: Home / Self Care | Payer: Medicaid Other | Attending: Emergency Medicine | Admitting: Emergency Medicine

## 2018-03-25 ENCOUNTER — Other Ambulatory Visit: Payer: Self-pay

## 2018-03-25 DIAGNOSIS — B379 Candidiasis, unspecified: Secondary | ICD-10-CM | POA: Insufficient documentation

## 2018-03-25 DIAGNOSIS — N898 Other specified noninflammatory disorders of vagina: Secondary | ICD-10-CM | POA: Diagnosis not present

## 2018-03-25 DIAGNOSIS — Z79899 Other long term (current) drug therapy: Secondary | ICD-10-CM | POA: Insufficient documentation

## 2018-03-25 DIAGNOSIS — F410 Panic disorder [episodic paroxysmal anxiety] without agoraphobia: Secondary | ICD-10-CM | POA: Diagnosis not present

## 2018-03-25 DIAGNOSIS — N390 Urinary tract infection, site not specified: Secondary | ICD-10-CM | POA: Diagnosis not present

## 2018-03-25 DIAGNOSIS — F419 Anxiety disorder, unspecified: Secondary | ICD-10-CM | POA: Diagnosis present

## 2018-03-25 LAB — WET PREP, GENITAL
Clue Cells Wet Prep HPF POC: NONE SEEN
Sperm: NONE SEEN
Trich, Wet Prep: NONE SEEN

## 2018-03-25 LAB — URINALYSIS, COMPLETE (UACMP) WITH MICROSCOPIC
Bacteria, UA: NONE SEEN
Bilirubin Urine: NEGATIVE
GLUCOSE, UA: NEGATIVE mg/dL
Hgb urine dipstick: NEGATIVE
Ketones, ur: NEGATIVE mg/dL
Nitrite: NEGATIVE
PROTEIN: NEGATIVE mg/dL
Specific Gravity, Urine: 1.012 (ref 1.005–1.030)
pH: 8 (ref 5.0–8.0)

## 2018-03-25 LAB — CHLAMYDIA/NGC RT PCR (ARMC ONLY)
CHLAMYDIA TR: NOT DETECTED
N GONORRHOEAE: NOT DETECTED

## 2018-03-25 MED ORDER — FLUCONAZOLE 50 MG PO TABS
150.0000 mg | ORAL_TABLET | Freq: Once | ORAL | Status: AC
  Administered 2018-03-25: 150 mg via ORAL
  Filled 2018-03-25: qty 1

## 2018-03-25 MED ORDER — FOSFOMYCIN TROMETHAMINE 3 G PO PACK
3.0000 g | PACK | Freq: Once | ORAL | Status: AC
  Administered 2018-03-25: 3 g via ORAL
  Filled 2018-03-25: qty 3

## 2018-03-25 NOTE — ED Notes (Addendum)
POC URINE PREG NEGATIVE.  

## 2018-03-25 NOTE — Discharge Instructions (Addendum)
You have been treated with a one-time dose of antifungal and antibiotic for your yeast infection and UTI.  Return to the ER for worsening symptoms, persistent vomiting, difficulty breathing or other concerns.

## 2018-03-25 NOTE — ED Triage Notes (Addendum)
Pt to ED via EMS from home. Pt states she was driving herself to the ED for a yeast infection she believes she developed over the weekend swimming in a hot tub, pt has yellow vaginal discharge with itching, denies odor. Pt states on the drive over she became short of breath and light headed, pt then called ems. Pt started to have a panic attack, states she has hx of them takes no meds for anxiety. Pt arrives anxious and hyperventilating.

## 2018-03-25 NOTE — ED Provider Notes (Signed)
[facility] Emergency Department Provider Note   ____________________________________________   First MD Initiated Contact with Patient 03/25/18 0036     (approximate)  I have reviewed the triage vital signs and the nursing notes.   HISTORY  Chief Complaint Anxiety and Vaginal Discharge    HPI Jaclyn Austin is a 28 y.o. female brought to the ED via EMS [place].  Patient was driving herself to the ED for evaluation of a yeast infection when she became anxious and had to pull over and call EMS.  Complains of vaginal discharge and itching.  Last sexual intercourse was unprotected over the weekend.  Denies concerns for STDs.  Anxiety has lessened since she has been brought to the emergency department.  Denies associated fever, chills, chest pain, shortness of breath, abdominal pain, nausea, vomiting or dysuria.  Denies recent travel or trauma.       Past Medical History:  Diagnosis Date  . Anemia   . PONV (postoperative nausea and vomiting)     Patient Active Problem List   Diagnosis Date Noted  . History of pregnancy induced hypertension 04/02/2016  . History of cesarean section 04/02/2016    Past Surgical History:  Procedure Laterality Date  . CESAREAN SECTION      Prior to Admission medications   Medication Sig Start Date End Date Taking? Authorizing Provider  Prenat-FeCbn-FeAsp-Meth-FA-DHA (PRENATE MINI) 18-0.6-0.4-350 MG CAPS Take 1 tablet by mouth daily. 05/15/16   Tresea Mall, CNM  sertraline (ZOLOFT) 50 MG tablet Take 1 tablet (50 mg total) by mouth daily. 12/11/16   Gunnar Bulla, CNM    Allergies Penicillins  Family History  Problem Relation Age of Onset  . Cancer Sister   . Cancer Maternal Grandmother     Social History Social History   Tobacco Use  . Smoking status: Never Smoker  . Smokeless tobacco: Never Used  Substance Use Topics  . Alcohol use: No  . Drug use: No    Review of Systems   Constitutional: No fever/chills Eyes: No visual changes. ENT: No sore throat. Cardiovascular: Denies chest pain. Respiratory: Denies shortness of breath. Gastrointestinal: No abdominal pain.  No nausea, no vomiting.  No diarrhea.  No constipation. Genitourinary: Positive for vaginal discharge and itching.  Negative for dysuria. Musculoskeletal: Negative for back pain. Skin: Negative for rash. Neurological: Negative for headaches, focal weakness or numbness. Psychiatric: Positive for anxiety and panic attack.  ____________________________________________   PHYSICAL EXAM:  VITAL SIGNS: ED Triage Vitals  Enc Vitals Group     BP 03/25/18 0012 (!) 130/102     Pulse Rate 03/25/18 0012 100     Resp 03/25/18 0012 16     Temp 03/25/18 0012 98.4 F (36.9 C)     Temp Source 03/25/18 0012 Oral     SpO2 03/25/18 0012 99 %     Weight 03/25/18 0013 129 lb 15.7 oz (59 kg)     Height 03/25/18 0013 5\' 4"  (1.626 m)     Head Circumference --      Peak Flow --      Pain Score 03/25/18 0013 0     Pain Loc --      Pain Edu? --      Excl. in GC? --     Constitutional: Alert and oriented. Well appearing and in no acute distress. Eyes: Conjunctivae are normal. PERRL. EOMI. Head: Atraumatic. Nose: No congestion/rhinnorhea. Mouth/Throat: Mucous membranes are moist.  Oropharynx non-erythematous. Neck: No stridor.  Cardiovascular: Normal rate, regular rhythm. Grossly normal heart sounds.  Good peripheral circulation. Respiratory: Normal respiratory effort.  No retractions. Lungs CTAB. Gastrointestinal: Soft and nontender to light or deep palpation. No distention. No abdominal bruits. No CVA tenderness. Musculoskeletal: No lower extremity tenderness nor edema.  No joint effusions. Neurologic:  Normal speech and language. No gross focal neurologic deficits are appreciated. No gait instability. Skin:  Skin is warm, dry and intact. No rash noted. Psychiatric: Mood and affect are normal. Speech and  behavior are normal.  ____________________________________________   LABS (all labs ordered are listed, but only abnormal results are displayed)  Labs Reviewed  WET PREP, GENITAL - Abnormal; Notable for the following components:      Result Value   Yeast Wet Prep HPF POC PRESENT (*)    WBC, Wet Prep HPF POC MANY (*)    All other components within normal limits  URINALYSIS, COMPLETE (UACMP) WITH MICROSCOPIC - Abnormal; Notable for the following components:   Color, Urine YELLOW (*)    APPearance HAZY (*)    Leukocytes,Ua LARGE (*)    All other components within normal limits  CHLAMYDIA/NGC RT PCR ([facility] ONLY)  POC URINE PREG, ED   ____________________________________________  EKG  None ____________________________________________  RADIOLOGY  ED MD interpretation: None  Official radiology report(s): No results found.  ____________________________________________   PROCEDURES  Procedure(s) performed (including Critical Care):  Procedures  Pelvic exam: External exam within normal limits without rashes, lesions or vesicles.  Speculum exam reveals white cheesy discharge.  Cervical  Os is closed.  Bimanual exam unremarkable.  Of note, patient was speaking on her cell phone through the entirety of her pelvic exam.  ____________________________________________   INITIAL IMPRESSION / ASSESSMENT AND PLAN / ED COURSE  As part of my medical decision making, I reviewed the following data within the electronic MEDICAL RECORD NUMBER Nursing notes reviewed and incorporated, Labs reviewed and Notes from prior ED visits        28 year old female who presents with vaginal discharge and panic attack.  Panic has lessened since she has been in the emergency department.  STI and wet prep in addition to urinalysis obtained.  Clinical Course as of Mar 24 400  Thu Mar 25, 2018  0255 Updated patient on all test results.  Will administer Diflucan and fosfomycin prior to discharge.   Strict return precautions given.  Patient verbalizes understanding agrees with plan of care.   [JS]    Clinical Course User Index [JS] Irean Hong, MD     ____________________________________________   FINAL CLINICAL IMPRESSION(S) / ED DIAGNOSES  Final diagnoses:  Panic attack  Vaginal discharge  Yeast infection  Lower urinary tract infectious disease     ED Discharge Orders    None       Note:  This document was prepared using Dragon voice recognition software and may include unintentional dictation errors.   Irean Hong, MD 03/25/18 [phone]

## 2018-09-21 ENCOUNTER — Other Ambulatory Visit: Payer: Self-pay

## 2018-09-21 ENCOUNTER — Emergency Department
Admission: EM | Admit: 2018-09-21 | Discharge: 2018-09-21 | Disposition: A | Discharge status: Home / Self Care | Payer: Medicaid Other

## 2018-09-21 NOTE — ED Notes (Signed)
Pt decides not be be triaged, st she just is wanting a COVID test, denies exposure or symptoms; st will f/u with [facility] or [facility] tomorrow; instr to return for any new or worsening symptoms

## 2018-11-04 ENCOUNTER — Ambulatory Visit: Payer: Self-pay | Admitting: Obstetrics and Gynecology

## 2018-11-08 ENCOUNTER — Telehealth: Payer: Self-pay

## 2018-11-08 NOTE — Telephone Encounter (Signed)
Pt prefer [facility]. Pt pcp faxed referral for abnormal pap fu.

## 2018-11-18 ENCOUNTER — Ambulatory Visit: Payer: Self-pay | Admitting: Obstetrics and Gynecology

## 2018-11-19 ENCOUNTER — Encounter: Payer: Medicaid Other | Admitting: Certified Nurse Midwife

## 2018-11-22 ENCOUNTER — Other Ambulatory Visit: Payer: Self-pay

## 2018-11-22 ENCOUNTER — Ambulatory Visit (INDEPENDENT_AMBULATORY_CARE_PROVIDER_SITE_OTHER): Payer: Medicaid Other | Admitting: Certified Nurse Midwife

## 2018-11-22 ENCOUNTER — Encounter: Payer: Self-pay | Admitting: Certified Nurse Midwife

## 2018-11-22 VITALS — BP 130/79 | HR 93 | Ht 64.0 in | Wt 137.1 lb

## 2018-11-22 DIAGNOSIS — R87629 Unspecified abnormal cytological findings in specimens from vagina: Secondary | ICD-10-CM

## 2018-11-22 NOTE — Patient Instructions (Signed)
Colposcopy Colposcopy is a procedure to examine the lowest part of the uterus (cervix), the vagina, and the area around the vaginal opening (vulva) for abnormalities or signs of disease. The procedure is done using a lighted microscope or magnifying lens (colposcope). If any unusual cells are found during the procedure, your health care provider may remove a tissue sample for testing (biopsy). A colposcopy may be done if you:  Have an abnormal Pap test. A Pap test is a screening test that is used to check for signs of cancer or infection of the vagina, cervix, and uterus.  Have a Pap smear test in which you test positive for high-risk HPV (human papillomavirus).  Have a sore or lesion on your cervix.  Have genital warts on your vulva, vagina, or cervix.  Took certain medicines while pregnant, such as diethylstilbestrol (DES).  Have pain during sexual intercourse.  Have vaginal bleeding, especially after sexual intercourse.  Need to have a cervical polyp removed.  Need to have a lost intrauterine device (IUD) string located. Let your health care provider know about:  Any allergies you have, including allergies to prescribed medicine, latex, or iodine.  All medicines you are taking, including vitamins, herbs, eye drops, creams, and over-the-counter medicines. Bring a list of all of your medicines to your appointment.  Any problems you or family members have had with anesthetic medicines.  Any blood disorders you have.  Any surgeries you have had.  Any medical conditions you have, such as pelvic inflammatory disease (PID) or endometrial disorder.  Any history of frequent fainting.  Your menstrual cycle and what form of birth control (contraception) you use.  Your medical history, including any prior cervical treatment.  Whether you are pregnant or may be pregnant. What are the risks? Generally, this is a safe procedure. However, problems may occur, including:  Pain.   Infection, which may include a fever, bad-smelling discharge, or pelvic pain.  Bleeding or discharge.  Misdiagnosis.  Fainting and vasovagal reactions, but this is rare.  Allergic reactions to medicines.  Damage to other structures or organs. What happens before the procedure?  If you have your menstrual period or will have it at the time of your procedure, tell your health care provider. A colposcopy typically is not done during menstruation.  Continue your contraceptive practices before and after the procedure.  For 24 hours before the colposcopy: ? Do not douche. ? Do not use tampons. ? Do not use medicines, creams, or suppositories in the vagina. ? Do not have sexual intercourse.  Ask your health care provider about: ? Changing or stopping your regular medicines. This is especially important if you are taking diabetes medicines or blood thinners. ? Taking medicines such as aspirin and ibuprofen. These medicines can thin your blood. Do not take these medicines before your procedure if your health care provider instructs you not to. It is likely that your health care provider will tell you to avoid taking aspirin or medicine that contains aspirin for 7 days before the procedure.  Follow instructions from your health care provider about eating or drinking restrictions. You will likely need to eat a regular diet the day of the procedure and not skip any meals.  You may have an exam or testing. A pregnancy test will be taken on the day of the procedure.  You may have a blood or urine sample taken.  Plan to have someone take you home from the hospital or clinic.  If you will be going   home right after the procedure, plan to have someone with you for 24 hours. What happens during the procedure?  You will lie down on your back, with your feet in foot rests (stirrups).  A warmed and lubricated instrument (speculum) will be inserted into your vagina. The speculum will be used to hold  apart the walls of your vagina so your health care provider can see your cervix and the inside of your vagina.  A cotton swab will be used to place a small amount of liquid solution on the areas to be examined. This solution makes it easier to see abnormal cells. You may feel a slight burning during this part.  The colposcope will be used to scan the cervix with a bright white light. The colposcope will be held near your vulvaand will magnify your vulva, vagina, and cervix for easier examination.  Your health care provider may decide to take a biopsy. If so: ? You may be given medicine to numb the area (local anesthetic). ? Surgical instruments will be used to suck out mucus and cells through your vagina. ? You may feel mild pain while the tissue sample is removed. ? Bleeding may occur. A solution may be used to stop the bleeding. ? If a sample of tissue is needed from the inside of the cervix, a different procedure called endocervical curettage (ECC) may be completed. During this procedure, a curved instrument (curette) will be used to scrape cells from your cervix or the top of your cervix (endocervix).  Your health care provider will record the location of any abnormalities. The procedure may vary among health care providers and hospitals. What happens after the procedure?  You will lie down and rest for a few minutes. You may be offered juice or cookies.  Your blood pressure, heart rate, breathing rate, and blood oxygen level will be monitored until any medicines you were given have worn off.  You may have to wear compression stockings. These stockings help to prevent blood clots and reduce swelling in your legs.  You may have some cramping in your abdomen. This should go away after a few minutes. This information is not intended to replace advice given to you by your health care provider. Make sure you discuss any questions you have with your health care provider. Document Released:  03/15/2002 Document Revised: 12/05/2016 Document Reviewed: 07/30/2015 Elsevier Patient Education  2020 Elsevier Inc.  

## 2018-11-22 NOTE — Progress Notes (Signed)
GYN ENCOUNTER NOTE  Subjective:       Jaclyn Austin is a 28 y.o. [phone] female is here for gynecologic evaluation of the following issues:  1. Follow up for abnormal pap smear.  Pt had pap smear collected at [facility] in April 27/2020. Result showed LSIL CINI  The DNA reflex criteria not met.    Gynecologic History Patient's last menstrual period was 10/24/2018 (approximate). Contraception: Nexplanon Last Pap: 05/03/18. Results were: abnormal Last mammogram: n/a .   Obstetric History OB History  Gravida Para Term Preterm AB Living  6 4 4   1 4   SAB TAB Ectopic Multiple Live Births  1     0 4    # Outcome Date GA Lbr Len/2nd Weight Sex Delivery Anes PTL Lv  6 Term 09/20/16    F Vag-Spont   LIV  5 Term 06/08/14 10w5d04:24 / 00:02 6 lb 8.2 oz (2.955 kg) F Vag-Spont None  LIV  4 Term 01/14/13 361w0d6 lb (2.722 kg) F CS-LTranv   LIV  3 SAB 05/22/11          2 Term 10/02/08 3813w0d lb 3 oz (2.807 kg) F Vag-Spont   LIV  1 Gravida             Past Medical History:  Diagnosis Date  . Anemia   . PONV (postoperative nausea and vomiting)     Past Surgical History:  Procedure Laterality Date  . CESAREAN SECTION      Current Outpatient Medications on File Prior to Visit  Medication Sig Dispense Refill  . etonogestrel (NEXPLANON) 68 MG IMPL implant 1 each by Subdermal route once.    . Prenat-FeCbn-FeAsp-Meth-FA-DHA (PRENATE MINI) 18-0.6-0.4-350 MG CAPS Take 1 tablet by mouth daily. (Patient not taking: Reported on 11/22/2018) 30 capsule 11  . sertraline (ZOLOFT) 50 MG tablet Take 1 tablet (50 mg total) by mouth daily. (Patient not taking: Reported on 11/22/2018) 30 tablet 5   No current facility-administered medications on file prior to visit.     Allergies  Allergen Reactions  . Penicillins Hives and Rash    Social History   Socioeconomic History  . Marital status: Single    Spouse name: Not on file  . Number of children: Not on file  . Years of  education: Not on file  . Highest education level: Not on file  Occupational History  . Not on file  Social Needs  . Financial resource strain: Not on file  . Food insecurity    Worry: Not on file    Inability: Not on file  . Transportation needs    Medical: Not on file    Non-medical: Not on file  Tobacco Use  . Smoking status: Never Smoker  . Smokeless tobacco: Never Used  Substance and Sexual Activity  . Alcohol use: No  . Drug use: No  . Sexual activity: Never    Birth control/protection: Implant    Comment: Nexplanon Inserted 09/23/2016  Lifestyle  . Physical activity    Days per week: Not on file    Minutes per session: Not on file  . Stress: Not on file  Relationships  . Social conHerbalist phone: Not on file    Gets together: Not on file    Attends religious service: Not on file    Active member of club or organization: Not on file    Attends meetings of clubs or organizations: Not on file  Relationship status: Not on file  . Intimate partner violence    Fear of current or ex partner: Not on file    Emotionally abused: Not on file    Physically abused: Not on file    Forced sexual activity: Not on file  Other Topics Concern  . Not on file  Social History Narrative  . Not on file    Family History  Problem Relation Age of Onset  . Cancer Sister   . Cancer Maternal Grandmother     The following portions of the patient's history were reviewed and updated as appropriate: allergies, current medications, past family history, past medical history, past social history, past surgical history and problem list.  Review of Systems Review of Systems - Negative except as mentioned in hpi Review of Systems - General ROS: negative for - chills, fatigue, fever, hot flashes, malaise or night sweats Hematological and Lymphatic ROS: negative for - bleeding problems or swollen lymph nodes Gastrointestinal ROS: negative for - abdominal pain, blood in stools,  change in bowel habits and nausea/vomiting Musculoskeletal ROS: negative for - joint pain, muscle pain or muscular weakness Genito-Urinary ROS: negative for - change in menstrual cycle, dysmenorrhea, dyspareunia, dysuria, genital discharge, genital ulcers, hematuria, incontinence, irregular/heavy menses, nocturia or pelvic painjj  Objective:   BP 130/79   Pulse 93   Ht 5' 4"  (1.626 m)   Wt 137 lb 2 oz (62.2 kg)   LMP 10/24/2018 (Approximate)   Breastfeeding No   BMI 23.54 kg/m  CONSTITUTIONAL: Well-developed, well-nourished female in no acute distress.  HENT:  Normocephalic, atraumatic.  NECK: Normal range of motion, supple SKIN: Skin is warm and dry. No rash noted. Not diaphoretic. No erythema. No pallor. [place]: Alert and oriented to person, place, and time. PSYCHIATRIC: Normal mood and affect. Normal behavior. Normal judgment and thought content. CARDIOVASCULAR:Not Examined RESPIRATORY: Not Examined BREASTS: Not Examined ABDOMEN: Soft, non distended; Non tender.  No Organomegaly. PELVIC:not indicated  MUSCULOSKELETAL: Normal range of motion. No tenderness.  No cyanosis, clubbing, or edema.   Assessment:   Abnormal pap smear  Plan:   Reviewed [place] records. Discussed LSIL results, discussed HPV testing not completed. PT state that she thought she had tested positive for HPV in the past. Explained different strains of HPV and that it is the high risk HPV that more likely to cause cancer. Discussed options of repeating pap in April 2021  or colposcopy procedure with MD. . Pt verbalized understanding request coposcopy . Information sheet given on HPV ,LSIL and colposcopy procedure . Follow up with MD for colposcopy. She agrees to plan .   I attest more than 50% of visit spent reviewing history , discussing pap results LSIL ,  HPV and colposcopy. All questions answered. Face to face time 15 min.   Philip Aspen, CNM

## 2018-12-21 ENCOUNTER — Ambulatory Visit (INDEPENDENT_AMBULATORY_CARE_PROVIDER_SITE_OTHER): Payer: Medicaid Other | Admitting: Obstetrics and Gynecology

## 2018-12-21 ENCOUNTER — Other Ambulatory Visit (HOSPITAL_COMMUNITY)
Admission: RE | Admit: 2018-12-21 | Discharge: 2018-12-21 | Disposition: A | Discharge status: Home / Self Care | Payer: Medicaid Other | Source: Ambulatory Visit | Attending: Obstetrics and Gynecology | Admitting: Obstetrics and Gynecology

## 2018-12-21 ENCOUNTER — Other Ambulatory Visit: Payer: Self-pay

## 2018-12-21 ENCOUNTER — Encounter: Payer: Medicaid Other | Admitting: Certified Nurse Midwife

## 2018-12-21 ENCOUNTER — Encounter: Payer: Self-pay | Admitting: Obstetrics and Gynecology

## 2018-12-21 VITALS — BP 131/83 | HR 90 | Ht 64.0 in | Wt 134.9 lb

## 2018-12-21 DIAGNOSIS — R87612 Low grade squamous intraepithelial lesion on cytologic smear of cervix (LGSIL): Secondary | ICD-10-CM

## 2018-12-21 NOTE — Addendum Note (Signed)
Addended by: Durwin Glaze on: 12/21/2018 11:49 AM   Modules accepted: Orders

## 2018-12-21 NOTE — Progress Notes (Signed)
Patient comes in today for colposcopy. 

## 2018-12-21 NOTE — Progress Notes (Signed)
HPI:  Jaclyn Austin is a 28 y.o.  [id number]  who presents today for evaluation and management of abnormal cervical cytology.    Dysplasia History:  LGSIL in May -patient states that she has had abnormals prior to this but "nothing was ever done."  ROS:  Pertinent items are noted in HPI.  OB History  Gravida Para Term Preterm AB Living  6 4 4   1 4   SAB TAB Ectopic Multiple Live Births  1     0 4    # Outcome Date GA Lbr Len/2nd Weight Sex Delivery Anes PTL Lv  6 Term 09/20/16    F Vag-Spont   LIV  5 Term 06/08/14 [redacted]w[redacted]d 04:24 / 00:02 6 lb 8.2 oz (2.955 kg) F Vag-Spont None  LIV  4 Term 01/14/13 [redacted]w[redacted]d  6 lb (2.722 kg) F CS-LTranv   LIV  3 SAB 05/22/11          2 Term 10/02/08 [redacted]w[redacted]d  6 lb 3 oz (2.807 kg) F Vag-Spont   LIV  1 Gravida             Past Medical History:  Diagnosis Date  . Anemia   . PONV (postoperative nausea and vomiting)     Past Surgical History:  Procedure Laterality Date  . CESAREAN SECTION      SOCIAL HISTORY: Social History   Substance and Sexual Activity  Alcohol Use No   Social History   Substance and Sexual Activity  Drug Use No     Family History  Problem Relation Age of Onset  . Cancer Sister   . Cancer Maternal Grandmother     ALLERGIES:  Penicillins  She has a current medication list which includes the following prescription(s): etonogestrel and sertraline.  Physical Exam: -Vitals:  BP 131/83   Pulse 90   Ht 5\' 4"  (1.626 m)   Wt 134 lb 14.4 oz (61.2 kg)   LMP 11/21/2018   BMI 23.16 kg/m  GEN: WD, WN, NAD.  A+ O x 3, good mood and affect. ABD:  NT, ND.  Soft, no masses.  No hernias noted.   Pelvic:   Vulva: Normal appearance.  No lesions.  Vagina: No lesions or abnormalities noted.  Support: Normal pelvic support.  Urethra No masses tenderness or scarring.  Meatus Normal size without lesions or prolapse.  Cervix: See below.  Anus: Normal exam.  No lesions.  Perineum: Normal exam.  No lesions.        Bimanual   Uterus:  Normal size.  Non-tender.  Mobile.  Retroverted  Adnexae: No masses.  Non-tender to palpation.  Cul-de-sac: Negative for abnormality.   PROCEDURE:    Colposcopy performed with 4% acetic acid after verbal consent obtained                           -Aceto-white Lesions Location(s): 11 o'clock.              -Biopsy performed at 11 o'clock               -ECC indicated and performed: No.     -Biopsy sites made hemostatic with pressure and Monsel's solution   -Satisfactory colposcopy: Yes.      -Evidence of Invasive cervical CA :  NO  ASSESSMENT:  Jaclyn Austin is a 28 y.o. [id number] here for  1. Low grade squamous intraepithelial lesion on cytologic smear of cervix (LGSIL)   .  PLAN: 1.  I discussed the grading system of pap smears and HPV high risk viral types.  We will discuss management after colpo results return.  No orders of the defined types were placed in this encounter.          F/U  Return in about 2 weeks (around 01/04/2019) for Colpo f/u. I spent 12 minutes involved in the care of this patient of which greater than 50% was spent discussing HPV grading of Pap smears and colposcopies.  Risk of cervical cancer, normal follow-up patterns.  Brennan Bailey ,MD 12/21/2018,10:37 AM

## 2018-12-22 LAB — SURGICAL PATHOLOGY

## 2019-01-12 ENCOUNTER — Encounter: Payer: Medicaid Other | Admitting: Obstetrics and Gynecology

## 2019-01-18 ENCOUNTER — Encounter: Payer: Medicaid Other | Admitting: Obstetrics and Gynecology

## 2019-02-08 ENCOUNTER — Encounter: Payer: Self-pay | Admitting: Obstetrics and Gynecology

## 2019-02-08 ENCOUNTER — Other Ambulatory Visit: Payer: Self-pay

## 2019-02-08 ENCOUNTER — Ambulatory Visit (INDEPENDENT_AMBULATORY_CARE_PROVIDER_SITE_OTHER): Payer: Medicaid Other | Admitting: Obstetrics and Gynecology

## 2019-02-08 VITALS — BP 113/85 | HR 86 | Ht 64.0 in | Wt 135.0 lb

## 2019-02-08 DIAGNOSIS — N870 Mild cervical dysplasia: Secondary | ICD-10-CM

## 2019-02-08 NOTE — Progress Notes (Signed)
Patient comes in today for colposcopy results.  

## 2019-02-08 NOTE — Progress Notes (Signed)
HPI:      [name] is a 29 y.o. [id number] who LMP was Patient's last menstrual period was 01/11/2019 (approximate).  Subjective:   She presents today for follow-up of her colposcopically directed biopsies revealing CIN-1.    Hx: The following portions of the patient's history were reviewed and updated as appropriate:             She  has a past medical history of Anemia and PONV (postoperative nausea and vomiting). She does not have any pertinent problems on file. She  has a past surgical history that includes Cesarean section. Her family history includes Cancer in her maternal grandmother and sister. She  reports that she has never smoked. She has never used smokeless tobacco. She reports that she does not drink alcohol or use drugs. She has a current medication list which includes the following prescription(s): etonogestrel and sertraline. She is allergic to penicillins.       Review of Systems:  Review of Systems  Constitutional: Denied constitutional symptoms, night sweats, recent illness, fatigue, fever, insomnia and weight loss.  Eyes: Denied eye symptoms, eye pain, photophobia, vision change and visual disturbance.  Ears/Nose/Throat/Neck: Denied ear, nose, throat or neck symptoms, hearing loss, nasal discharge, sinus congestion and sore throat.  Cardiovascular: Denied cardiovascular symptoms, arrhythmia, chest pain/pressure, edema, exercise intolerance, orthopnea and palpitations.  Respiratory: Denied pulmonary symptoms, asthma, pleuritic pain, productive sputum, cough, dyspnea and wheezing.  Gastrointestinal: Denied, gastro-esophageal reflux, melena, nausea and vomiting.  Genitourinary: Denied genitourinary symptoms including symptomatic vaginal discharge, pelvic relaxation issues, and urinary complaints.  Musculoskeletal: Denied musculoskeletal symptoms, stiffness, swelling, muscle weakness and myalgia.  Dermatologic: Denied dermatology symptoms, rash and scar.  Neurologic:  Denied neurology symptoms, dizziness, headache, neck pain and syncope.  Psychiatric: Denied psychiatric symptoms, anxiety and depression.  Endocrine: Denied endocrine symptoms including hot flashes and night sweats.   Meds:   Current Outpatient Medications on File Prior to Visit  Medication Sig Dispense Refill  . etonogestrel (NEXPLANON) 68 MG IMPL implant 1 each by Subdermal route once.    . sertraline (ZOLOFT) 50 MG tablet Take 1 tablet (50 mg total) by mouth daily. 30 tablet 5   No current facility-administered medications on file prior to visit.    Objective:     Vitals:   02/08/19 0853  BP: 113/85  Pulse: 86              CIN-1  Assessment:    [id number] Patient Active Problem List   Diagnosis Date Noted  . History of pregnancy induced hypertension 04/02/2016  . History of cesarean section 04/02/2016     1. Mild dysplasia of cervix (CIN I)        Plan:            1.  We have discussed CIN-1 and its relationship to cervical cancer.  I discussed this as a dysplastic/precancerous lesion with a very low risk of progression all the way to cancer.  HPV I have discussed HPV with the patient in detail.  She is aware that HPV is a sexually transmitted disease and that it is estimated that approximately 2/3 of all sexually active people in this country have this virus.  I reviewed our basic understanding of this virus and its infectivity and transmission.  We have discussed the problems caused by this virus including external condyloma, vaginal and cervical condyloma, cervical dysplasia and its link to cervical cancer.  Treatment of external condyloma, internal condyloma and dysplasia has also  been discussed.  The virus and its natural course and history were reviewed in detail.  The necessity for adequate follow-up including pap-smears, viral testing and possible Colposcopy with biopsies has been discussed.  A number of treatment options have also been reviewed.  The use of  condoms to help decrease the likelihood of spread of this virus was discussed with the patient.  Literature on HPV virus was provided.  All of the patient's questions were answered.   Per ASC CP guidelines I have recommended a follow-up Pap with cotesting 1 year from her colposcopy.      F/U  Return in about 1 year (around 02/08/2020). I spent 13 minutes involved in the care of this patient preparing to see the patient by obtaining and reviewing her medical history (including labs, imaging tests and prior procedures), documenting clinical information in the electronic health record (EHR), counseling and coordinating care plans, writing and sending prescriptions, ordering tests or procedures and directly communicating with the patient by discussing pertinent items from her history and physical exam as well as detailing my assessment and plan as noted above so that she has an informed understanding.  All of her questions were answered.  Finis Bud, M.D. 02/08/2019 9:05 AM

## 2019-02-12 ENCOUNTER — Encounter: Payer: Self-pay | Admitting: Emergency Medicine

## 2019-02-12 ENCOUNTER — Emergency Department
Admission: EM | Admit: 2019-02-12 | Discharge: 2019-02-12 | Disposition: A | Discharge status: Home / Self Care | Payer: Medicaid Other | Attending: Student | Admitting: Student

## 2019-02-12 ENCOUNTER — Other Ambulatory Visit: Payer: Self-pay

## 2019-02-12 DIAGNOSIS — N760 Acute vaginitis: Secondary | ICD-10-CM | POA: Insufficient documentation

## 2019-02-12 DIAGNOSIS — Z79899 Other long term (current) drug therapy: Secondary | ICD-10-CM | POA: Insufficient documentation

## 2019-02-12 DIAGNOSIS — R300 Dysuria: Secondary | ICD-10-CM | POA: Diagnosis present

## 2019-02-12 DIAGNOSIS — B029 Zoster without complications: Secondary | ICD-10-CM

## 2019-02-12 DIAGNOSIS — B9689 Other specified bacterial agents as the cause of diseases classified elsewhere: Secondary | ICD-10-CM | POA: Insufficient documentation

## 2019-02-12 LAB — URINALYSIS, COMPLETE (UACMP) WITH MICROSCOPIC
Bacteria, UA: NONE SEEN
Bilirubin Urine: NEGATIVE
Glucose, UA: NEGATIVE mg/dL
Hgb urine dipstick: NEGATIVE
Ketones, ur: NEGATIVE mg/dL
Leukocytes,Ua: NEGATIVE
Nitrite: NEGATIVE
Protein, ur: NEGATIVE mg/dL
Specific Gravity, Urine: 1.006 (ref 1.005–1.030)
Squamous Epithelial / HPF: NONE SEEN (ref 0–5)
pH: 7 (ref 5.0–8.0)

## 2019-02-12 LAB — WET PREP, GENITAL
Sperm: NONE SEEN
Trich, Wet Prep: NONE SEEN
WBC, Wet Prep HPF POC: NONE SEEN
Yeast Wet Prep HPF POC: NONE SEEN

## 2019-02-12 LAB — POCT PREGNANCY, URINE: Preg Test, Ur: NEGATIVE

## 2019-02-12 MED ORDER — METRONIDAZOLE 500 MG PO TABS: 500.0000 mg | ORAL_TABLET | Freq: Two times a day (BID) | ORAL | 0 refills | Status: AC

## 2019-02-12 MED ORDER — METRONIDAZOLE 500 MG PO TABS
500.0000 mg | ORAL_TABLET | Freq: Once | ORAL | Status: AC
  Administered 2019-02-12: 20:00:00 500 mg via ORAL
  Filled 2019-02-12: qty 1

## 2019-02-12 MED ORDER — ACYCLOVIR 400 MG PO TABS: 800.0000 mg | ORAL_TABLET | Freq: Every day | ORAL | 1 refills | Status: AC

## 2019-02-12 NOTE — ED Notes (Signed)
See triage note. Pt c/o UTI that has not resolved for 2wks. Pt stating she just started the anx prescribed today. Pt stating she has had blood in her urine. Pt also states she has a hx of shingles and believes she is currently having a flare up. Pt A&Ox4. Pt denies any needs at this time.

## 2019-02-12 NOTE — ED Provider Notes (Signed)
[facility] Emergency Department Provider Note ____________________________________________  Time seen: 1846  I have reviewed the triage vital signs and the nursing notes.  HISTORY  Chief Complaint  Urinary Tract Infection  HPI Jaclyn Austin is a 29 y.o. female presents herself to the ED for evaluation of dysuria and pelvic cramping.    She apparently had a telemetry visit with her primary provider earlier this week, and was prescribed antibiotics.  She admits that she did not start antibiotic until this morning, and presents with concerns that the UTI is worsening.  She continues to note urinary frequency, pelvic discomfort, and some scant bloody urine.  She denies any fevers, chills, or sweats patient also denies any abnormal vaginal discharge, flank pain, or urinary retention.  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  She presents with a painful vesicular rash to the posterior left shoulder, consistent with previous outbreaks.  She reports 2 outbreaks last year and this would make her third.  Past Medical History:  Diagnosis Date  . Anemia   . PONV (postoperative nausea and vomiting)     Patient Active Problem List   Diagnosis Date Noted  . History of pregnancy induced hypertension 04/02/2016  . History of cesarean section 04/02/2016    Past Surgical History:  Procedure Laterality Date  . CESAREAN SECTION      Prior to Admission medications   Medication Sig Start Date End Date Taking? Authorizing Provider  acyclovir (ZOVIRAX) 400 MG tablet Take 2 tablets (800 mg total) by mouth 5 (five) times daily for 14 days. Repeat within 24 hours of recurrence 02/12/19 02/26/19  Devynn Hessler, Dannielle Karvonen, PA-C  etonogestrel (NEXPLANON) 68 MG IMPL implant 1 each by Subdermal route once.    [provider]  metroNIDAZOLE (FLAGYL) 500 MG tablet Take 1 tablet (500 mg total) by mouth 2 (two) times daily for 7 days. 02/12/19 02/19/19   Shloma Roggenkamp, Dannielle Karvonen, PA-C  sertraline (ZOLOFT) 50 MG tablet Take 1 tablet (50 mg total) by mouth daily. 12/11/16   Diona Fanti, CNM    Allergies Penicillins  Family History  Problem Relation Age of Onset  . Cancer Sister   . Cancer Maternal Grandmother     Social History Social History   Tobacco Use  . Smoking status: Never Smoker  . Smokeless tobacco: Never Used  Substance Use Topics  . Alcohol use: No  . Drug use: No    Review of Systems  Constitutional: Negative for fever. Cardiovascular: Negative for chest pain. Respiratory: Negative for shortness of breath. Gastrointestinal: Negative for abdominal pain, vomiting and diarrhea. Genitourinary: Positive for dysuria. Musculoskeletal: Negative for back pain. Skin: Positive for rash. Neurological: Negative for headaches, focal weakness or numbness. ____________________________________________  PHYSICAL EXAM:  VITAL SIGNS: ED Triage Vitals  Enc Vitals Group     BP 02/12/19 1806 (!) 151/94     Pulse Rate 02/12/19 1806 (!) 108     Resp 02/12/19 1806 18     Temp 02/12/19 1806 99.3 F (37.4 C)     Temp Source 02/12/19 1806 Oral     SpO2 02/12/19 1806 99 %     Weight --      Height --      Head Circumference --      Peak Flow --      Pain Score 02/12/19 1819 5     Pain Loc --      Pain Edu? --  Excl. in GC? --     Constitutional: Alert and oriented. Well appearing and in no distress. Head: Normocephalic and atraumatic. Eyes: Conjunctivae are normal. Normal extraocular movements Cardiovascular: Normal rate, regular rhythm. Normal distal pulses. Respiratory: Normal respiratory effort. No wheezes/rales/rhonchi. GU: normal external genitalia. No vaginal discharge in the vault. Bloody mucous in the cervical os. No CMT or adnexal masses appreciated.  Musculoskeletal: Nontender with normal range of motion in all extremities.  Neurologic:  Normal gait without ataxia. Normal speech and language. No  gross focal neurologic deficits are appreciated. Skin:  Skin is warm, dry and intact. Single collection of grouped vesicles to the posterior left shoulder, concerning for herpes zoster Psychiatric: Mood and affect are normal. Patient exhibits appropriate insight and judgment. ____________________________________________   LABS (pertinent positives/negatives) Labs Reviewed  WET PREP, GENITAL - Abnormal; Notable for the following components:      Result Value   Clue Cells Wet Prep HPF POC PRESENT (*)    All other components within normal limits  URINALYSIS, COMPLETE (UACMP) WITH MICROSCOPIC - Abnormal; Notable for the following components:   Color, Urine STRAW (*)    APPearance CLEAR (*)    All other components within normal limits  URINE CULTURE  POC URINE PREG, ED  POCT PREGNANCY, URINE  ____________________________________________  PROCEDURES  Metronidazole 500 mg PO Procedures ____________________________________________  INITIAL IMPRESSION / ASSESSMENT AND PLAN / ED COURSE  Patient with ED evaluation of dysuria and pelvic cramping.  Clinical picture is concerning for a possible vaginitis versus subclinical UTI.  Urinalysis did not reveal any leukocyturia or bacteriuria.  Patient is currently on an antibiotic that she started today.  Urine culture is pending to further direct management of her suspected UTI.  Wet prep did reveal clue cells she was treated with Flagyl for that.  She also was given a prescription for acyclovir for shingles suppression.  She will follow-up with primary provider for further management and await urine culture results.  Jaclyn Austin was evaluated in Emergency Department on 02/12/2019 for the symptoms described in the history of present illness. She was evaluated in the context of the global COVID-19 pandemic, which necessitated consideration that the patient might be at risk for infection with the SARS-CoV-2 virus that causes COVID-19. Institutional protocols  and algorithms that pertain to the evaluation of patients at risk for COVID-19 are in a state of rapid change based on information released by regulatory bodies including the CDC and federal and state organizations. These policies and algorithms were followed during the patient's care in the ED. 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

## 2019-02-12 NOTE — ED Triage Notes (Signed)
Pt to ED with c/o of increased UTI symptoms. Pt had tele visit with PCP earlier this week and prescribed antibiotics which she did not start until today. Pt states she is concerned that the UTI has gotten worse.

## 2019-02-12 NOTE — ED Notes (Signed)
Pelvic cart at bedside. 

## 2019-02-12 NOTE — Discharge Instructions (Addendum)
Your urinalysis is normal. A urine culture is pending at this time. Continue to take your antibiotic until your culture results are available. You are being treated for BV, take the antibiotic as directed. Take the anti-viral as directed. You should talk to your primary provider about daily suppression therapy, due to multiple outbreaks in year.

## 2019-02-14 LAB — URINE CULTURE: Culture: NO GROWTH

## 2019-02-24 ENCOUNTER — Telehealth: Payer: Self-pay | Admitting: Obstetrics and Gynecology

## 2019-02-24 NOTE — Telephone Encounter (Signed)
Spoke with patient and she is having break through bleeding. She is nearing the end of her nexplanon life. I have scheduled her to come in next week to discuss.

## 2019-02-24 NOTE — Telephone Encounter (Signed)
Patient called saying she was passing dark "tissue". She recently had a colpo performed. Could you please advise patient.   -TC

## 2019-03-01 ENCOUNTER — Other Ambulatory Visit: Payer: Self-pay

## 2019-03-01 ENCOUNTER — Encounter: Payer: Self-pay | Admitting: Obstetrics and Gynecology

## 2019-03-01 ENCOUNTER — Ambulatory Visit (INDEPENDENT_AMBULATORY_CARE_PROVIDER_SITE_OTHER): Payer: Medicaid Other | Admitting: Obstetrics and Gynecology

## 2019-03-01 VITALS — BP 125/77 | HR 93 | Ht 63.0 in | Wt 133.0 lb

## 2019-03-01 DIAGNOSIS — N941 Unspecified dyspareunia: Secondary | ICD-10-CM | POA: Diagnosis not present

## 2019-03-01 DIAGNOSIS — R102 Pelvic and perineal pain: Secondary | ICD-10-CM

## 2019-03-01 LAB — POCT URINALYSIS DIPSTICK
Bilirubin, UA: NEGATIVE
Blood, UA: NEGATIVE
Glucose, UA: NEGATIVE
Ketones, UA: NEGATIVE
Leukocytes, UA: NEGATIVE
Nitrite, UA: NEGATIVE
Protein, UA: NEGATIVE
Spec Grav, UA: 1.01 (ref 1.010–1.025)
Urobilinogen, UA: 0.2 E.U./dL
pH, UA: 6.5 (ref 5.0–8.0)

## 2019-03-01 NOTE — Progress Notes (Signed)
HPI:      [name] is a 29 y.o. [id number] who LMP was Patient's last menstrual period was 02/13/2019.  Subjective:   She presents today with complaint of recent onset of pelvic pain and pain with intercourse.  This is mostly left-sided.  She states that her pain is mostly crampy pain. She has a Nexplanon for birth control and she is not particularly happy with it.  She states that she gets a 7-day menstrual period each month and this last.  Had some clots and worse cramping than usual.  She is considering Nexplanon removal and use of OCPs instead.  Her Nexplanon expires in September of this year.  She was recently seen in the emergency department for similar complaints and was found to have BV and treated appropriately.  Her symptoms did not resolve.    Hx: The following portions of the patient's history were reviewed and updated as appropriate:             She  has a past medical history of Anemia and PONV (postoperative nausea and vomiting). She does not have any pertinent problems on file. She  has a past surgical history that includes Cesarean section. Her family history includes Cancer in her maternal grandmother and sister. She  reports that she has never smoked. She has never used smokeless tobacco. She reports that she does not drink alcohol or use drugs. She has a current medication list which includes the following prescription(s): etonogestrel and sertraline. She is allergic to penicillins.       Review of Systems:  Review of Systems  Constitutional: Denied constitutional symptoms, night sweats, recent illness, fatigue, fever, insomnia and weight loss.  Eyes: Denied eye symptoms, eye pain, photophobia, vision change and visual disturbance.  Ears/Nose/Throat/Neck: Denied ear, nose, throat or neck symptoms, hearing loss, nasal discharge, sinus congestion and sore throat.  Cardiovascular: Denied cardiovascular symptoms, arrhythmia, chest pain/pressure, edema, exercise  intolerance, orthopnea and palpitations.  Respiratory: Denied pulmonary symptoms, asthma, pleuritic pain, productive sputum, cough, dyspnea and wheezing.  Gastrointestinal: Denied, gastro-esophageal reflux, melena, nausea and vomiting.  Genitourinary: See HPI for additional information.  Musculoskeletal: Denied musculoskeletal symptoms, stiffness, swelling, muscle weakness and myalgia.  Dermatologic: Denied dermatology symptoms, rash and scar.  Neurologic: Denied neurology symptoms, dizziness, headache, neck pain and syncope.  Psychiatric: Denied psychiatric symptoms, anxiety and depression.  Endocrine: Denied endocrine symptoms including hot flashes and night sweats.   Meds:   Current Outpatient Medications on File Prior to Visit  Medication Sig Dispense Refill  . etonogestrel (NEXPLANON) 68 MG IMPL implant 1 each by Subdermal route once.    . sertraline (ZOLOFT) 50 MG tablet Take 1 tablet (50 mg total) by mouth daily. (Patient not taking: Reported on 03/01/2019) 30 tablet 5   No current facility-administered medications on file prior to visit.    Objective:     Vitals:   03/01/19 1055  BP: 125/77  Pulse: 93                Assessment:    [id number] Patient Active Problem List   Diagnosis Date Noted  . History of pregnancy induced hypertension 04/02/2016  . History of cesarean section 04/02/2016     1. Pelvic pain in female   2. Dyspareunia in female     Based on the patient's symptoms differential diagnosis includes urinary tract infection and possible ovarian cyst.   Plan:            1.  UA-C&S sent  today.  2.  Ultrasound to rule out ovarian cyst or other pelvic pathology  3.  GC/CT today -although I doubt PID.  4.  Patient would like to schedule for Nexplanon removal and she has decided to begin OCPs. Orders Orders Placed This Encounter  Procedures  . US PELVIS (TRANSABDOMINAL ONLY)    No orders of the defined types were placed in this encounter.      F/U  Return in about 1 week (around 03/08/2019). I spent 22 minutes involved in the care of this patient preparing to see the patient by obtaining and reviewing her medical history (including labs, imaging tests and prior procedures), documenting clinical information in the electronic health record (EHR), counseling and coordinating care plans, writing and sending prescriptions, ordering tests or procedures and directly communicating with the patient by discussing pertinent items from her history and physical exam as well as detailing my assessment and plan as noted above so that she has an informed understanding.  All of her questions were answered.  Elonda Husky, M.D. 03/01/2019 11:28 AM

## 2019-03-01 NOTE — Addendum Note (Signed)
Addended by: Dorian Pod on: 03/01/2019 02:22 PM   Modules accepted: Orders

## 2019-03-02 ENCOUNTER — Ambulatory Visit (INDEPENDENT_AMBULATORY_CARE_PROVIDER_SITE_OTHER): Payer: Medicaid Other

## 2019-03-02 DIAGNOSIS — R102 Pelvic and perineal pain: Secondary | ICD-10-CM | POA: Diagnosis not present

## 2019-03-02 DIAGNOSIS — N8302 Follicular cyst of left ovary: Secondary | ICD-10-CM | POA: Diagnosis not present

## 2019-03-02 DIAGNOSIS — Z975 Presence of (intrauterine) contraceptive device: Secondary | ICD-10-CM

## 2019-03-02 DIAGNOSIS — N941 Unspecified dyspareunia: Secondary | ICD-10-CM | POA: Diagnosis not present

## 2019-03-02 DIAGNOSIS — N854 Malposition of uterus: Secondary | ICD-10-CM

## 2019-03-02 LAB — GC/CHLAMYDIA PROBE AMP
Chlamydia trachomatis, NAA: NEGATIVE
Neisseria Gonorrhoeae by PCR: NEGATIVE

## 2019-03-03 LAB — URINE CULTURE

## 2019-03-08 ENCOUNTER — Ambulatory Visit: Payer: Medicaid Other | Admitting: Obstetrics and Gynecology

## 2019-03-08 ENCOUNTER — Encounter: Payer: Medicaid Other | Admitting: Obstetrics and Gynecology

## 2019-03-15 ENCOUNTER — Ambulatory Visit: Payer: Medicaid Other | Admitting: Obstetrics and Gynecology

## 2019-04-06 ENCOUNTER — Ambulatory Visit (INDEPENDENT_AMBULATORY_CARE_PROVIDER_SITE_OTHER): Payer: Medicaid Other | Admitting: Obstetrics and Gynecology

## 2019-04-06 ENCOUNTER — Encounter: Payer: Self-pay | Admitting: Obstetrics and Gynecology

## 2019-04-06 ENCOUNTER — Other Ambulatory Visit: Payer: Self-pay

## 2019-04-06 VITALS — BP 125/81 | HR 93 | Ht 63.0 in | Wt 141.8 lb

## 2019-04-06 DIAGNOSIS — Z30011 Encounter for initial prescription of contraceptive pills: Secondary | ICD-10-CM | POA: Diagnosis not present

## 2019-04-06 DIAGNOSIS — Z3046 Encounter for surveillance of implantable subdermal contraceptive: Secondary | ICD-10-CM | POA: Diagnosis not present

## 2019-04-06 DIAGNOSIS — T8332XA Displacement of intrauterine contraceptive device, initial encounter: Secondary | ICD-10-CM

## 2019-04-06 MED ORDER — DESOGESTREL-ETHINYL ESTRADIOL 0.15-0.02/0.01 MG (21/5) PO TABS: 1.0000 | ORAL_TABLET | Freq: Every day | ORAL | 2 refills | Status: AC

## 2019-04-06 NOTE — Progress Notes (Signed)
HPI:      [name] is a 29 y.o. [phone] who LMP was No LMP recorded.  Subjective:   She presents today for Nexplanon removal.  Patient would like to start OCPs she is unhappy with the [place]. She had an ultrasound for pelvic pain which reveals what is almost certainly an intrauterine IUD. She was very upset to learn that she still had an IUD and had Nexplanon at the same time.  She states that the last time she had an IUD was 3 children ago and wants to know how it is possible that she could have an IUD and get pregnant 3 times.  This not to mention how did it not come out with the pregnancies.  She has no idea what type of IUD she had placed.  We specifically talked about possible postpartum placement while in the hospital and speculated a number of things but she states she would have known and she does not remember anything about an IUD ever being placed. She is still interested in starting OCPs because if she has had an IUD it has not been effective for birth control.    Hx: The following portions of the patient's history were reviewed and updated as appropriate:             She  has a past medical history of Anemia and PONV (postoperative nausea and vomiting). She does not have any pertinent problems on file. She  has a past surgical history that includes Cesarean section. Her family history includes Cancer in her maternal grandmother and sister. She  reports that she has never smoked. She has never used smokeless tobacco. She reports that she does not drink alcohol or use drugs. She has a current medication list which includes the following prescription(s): desogestrel-ethinyl estradiol, etonogestrel, and sertraline. She is allergic to penicillins.       Review of Systems:  Review of Systems  Constitutional: Denied constitutional symptoms, night sweats, recent illness, fatigue, fever, insomnia and weight loss.  Eyes: Denied eye symptoms, eye pain, photophobia, vision change  and visual disturbance.  Ears/Nose/Throat/Neck: Denied ear, nose, throat or neck symptoms, hearing loss, nasal discharge, sinus congestion and sore throat.  Cardiovascular: Denied cardiovascular symptoms, arrhythmia, chest pain/pressure, edema, exercise intolerance, orthopnea and palpitations.  Respiratory: Denied pulmonary symptoms, asthma, pleuritic pain, productive sputum, cough, dyspnea and wheezing.  Gastrointestinal: Denied, gastro-esophageal reflux, melena, nausea and vomiting.  Genitourinary: Denied genitourinary symptoms including symptomatic vaginal discharge, pelvic relaxation issues, and urinary complaints.  Musculoskeletal: Denied musculoskeletal symptoms, stiffness, swelling, muscle weakness and myalgia.  Dermatologic: Denied dermatology symptoms, rash and scar.  Neurologic: Denied neurology symptoms, dizziness, headache, neck pain and syncope.  Psychiatric: Denied psychiatric symptoms, anxiety and depression.  Endocrine: Denied endocrine symptoms including hot flashes and night sweats.   Meds:   Current Outpatient Medications on File Prior to Visit  Medication Sig Dispense Refill  . etonogestrel (NEXPLANON) 68 MG IMPL implant 1 each by Subdermal route once.    . sertraline (ZOLOFT) 50 MG tablet Take 1 tablet (50 mg total) by mouth daily. (Patient not taking: Reported on 03/01/2019) 30 tablet 5   No current facility-administered medications on file prior to visit.    Objective:     Vitals:   04/06/19 1520  BP: 125/81  Pulse: 93              Implanon removal Procedure note - The implant was noted in the patient's arm and the end was identified. The  skin was cleansed with a Betadine solution. A small injection of subcutaneous lidocaine with epinephrine was given over the end of the rod. An incision was made at the end of the implant. The rod was noted in the incision and grasp with a hemostat. The capsule was nicked and the rod was again grasped and removed. It was noted to  be intact.  The patient's skin was again cleansed.  Steri-Strips were placed approximating the incision. Hemostasis was noted.  Pelvic exam was performed and no IUD strings were noted tonsil was used to try to feel the IUD or remove it blindly from the endometrium.  Patient tolerated this well with some cramping.  IUD not easily located.   Assessment:    [id number] Patient Active Problem List   Diagnosis Date Noted  . History of pregnancy induced hypertension 04/02/2016  . History of cesarean section 04/02/2016     1. Nexplanon removal   2. Intrauterine contraceptive device threads lost, initial encounter   3. Initiation of OCP (BCP)     IUD strings lost -patient unaware of the presence of IUD   Plan:            1.  OCPs The risks /benefits of OCPs have been explained to the patient in detail.  Product literature has been given to her where appropriate.  I have instructed her in the use of OCPs.  I have explained to the patient that OCPs are not as effective for birth control during the first month of use, and that another form of contraception should be used during this time.  Both first-day start and Sunday start have been explained.  The risks and benefits of each was discussed.  She has been made aware of  the fact that in rare circumstances, other medications may affect the efficacy of OCPs.  I have answered all of her questions, and I believe that she has an understanding of the effectiveness and use of OCPs. 2.  Patient to return for ultrasound-guided IUD removal  Orders Orders Placed This Encounter  Procedures  . US PELVIS (TRANSABDOMINAL ONLY)     Meds ordered this encounter  Medications  . desogestrel-ethinyl estradiol (MIRCETTE) 0.15-0.02/0.01 MG (21/5) tablet    Sig: Take 1 tablet by mouth at bedtime.    Dispense:  1 Package    Refill:  2      F/U  Return in about 1 week (around 04/13/2019). I spent 45 minutes involved in the care of this patient preparing to see the  patient by obtaining and reviewing her medical history (including labs, imaging tests and prior procedures), documenting clinical information in the electronic health record (EHR), counseling and coordinating care plans, writing and sending prescriptions, ordering tests or procedures and directly communicating with the patient by discussing pertinent items from her history and physical exam as well as detailing my assessment and plan as noted above so that she has an informed understanding.  All of her questions were answered.  This was a very complicated visit.  Patient was unaware of her IUD, records were combed trying to locate when the IUD was placed and who did it.  Could not verify insertion or removal.  Patient very upset about having to forms of birth control at the same time.  Patient also upset about if the IUD was present why did not work for birth control.  She had to be repeatedly reassured regarding 2 forms of birth control and that no physical harm was done  to her by this.  Elonda Husky, M.D. 04/06/2019 4:57 PM

## 2019-04-19 ENCOUNTER — Other Ambulatory Visit: Payer: Medicaid Other

## 2019-04-19 ENCOUNTER — Encounter: Payer: Medicaid Other | Admitting: Obstetrics and Gynecology

## 2019-04-26 ENCOUNTER — Encounter: Payer: Self-pay | Admitting: Surgical

## 2019-04-26 ENCOUNTER — Other Ambulatory Visit: Payer: Self-pay | Admitting: Obstetrics and Gynecology

## 2019-04-26 DIAGNOSIS — T8332XA Displacement of intrauterine contraceptive device, initial encounter: Secondary | ICD-10-CM

## 2019-04-28 ENCOUNTER — Other Ambulatory Visit: Payer: Medicaid Other

## 2019-04-28 ENCOUNTER — Encounter: Payer: Self-pay | Admitting: Obstetrics and Gynecology

## 2019-05-03 ENCOUNTER — Other Ambulatory Visit: Payer: Self-pay

## 2019-05-03 ENCOUNTER — Ambulatory Visit
Admission: RE | Admit: 2019-05-03 | Discharge: 2019-05-03 | Disposition: A | Discharge status: Home / Self Care | Payer: Medicaid Other | Source: Ambulatory Visit | Attending: Obstetrics and Gynecology | Admitting: Obstetrics and Gynecology

## 2019-05-03 ENCOUNTER — Encounter: Payer: Medicaid Other | Admitting: Obstetrics and Gynecology

## 2019-05-03 ENCOUNTER — Other Ambulatory Visit: Payer: Self-pay | Admitting: Obstetrics and Gynecology

## 2019-05-03 ENCOUNTER — Ambulatory Visit (INDEPENDENT_AMBULATORY_CARE_PROVIDER_SITE_OTHER): Payer: Medicaid Other

## 2019-05-03 DIAGNOSIS — T8332XA Displacement of intrauterine contraceptive device, initial encounter: Secondary | ICD-10-CM

## 2019-05-04 ENCOUNTER — Encounter: Payer: Medicaid Other | Admitting: Obstetrics and Gynecology

## 2020-02-16 ENCOUNTER — Encounter: Payer: Self-pay | Admitting: Obstetrics and Gynecology

## 2020-02-21 ENCOUNTER — Encounter: Payer: Self-pay | Admitting: Obstetrics and Gynecology
